# Patient Record
Sex: Male | Born: 1995
Health system: Southern US, Community
[De-identification: ages and names within clinical notes are randomized; demographics above are authoritative.]

## PROBLEM LIST (undated history)

## (undated) DIAGNOSIS — F419 Anxiety disorder, unspecified: Secondary | ICD-10-CM

## (undated) DIAGNOSIS — F319 Bipolar disorder, unspecified: Secondary | ICD-10-CM

---
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
## 2014-02-03 ENCOUNTER — Emergency Department (HOSPITAL_COMMUNITY)
Admission: EM | Admit: 2014-02-03 | Discharge: 2014-02-03 | Disposition: A | Discharge status: Home / Self Care | Payer: Medicaid Other | Attending: Emergency Medicine | Admitting: Emergency Medicine

## 2014-02-03 ENCOUNTER — Encounter (HOSPITAL_COMMUNITY): Payer: Self-pay | Admitting: Emergency Medicine

## 2014-02-03 DIAGNOSIS — K047 Periapical abscess without sinus: Secondary | ICD-10-CM | POA: Insufficient documentation

## 2014-02-03 DIAGNOSIS — F17200 Nicotine dependence, unspecified, uncomplicated: Secondary | ICD-10-CM | POA: Insufficient documentation

## 2014-02-03 DIAGNOSIS — Z792 Long term (current) use of antibiotics: Secondary | ICD-10-CM | POA: Insufficient documentation

## 2014-02-03 MED ORDER — AMOXICILLIN 500 MG PO CAPS: 500.0000 mg | ORAL_CAPSULE | Freq: Three times a day (TID) | ORAL | Status: DC

## 2014-02-03 MED ORDER — HYDROCODONE-ACETAMINOPHEN 5-325 MG PO TABS
1.0000 | ORAL_TABLET | Freq: Once | ORAL | Status: AC
  Administered 2014-02-03: 1 via ORAL
  Filled 2014-02-03: qty 1

## 2014-02-03 MED ORDER — AMOXICILLIN 500 MG PO CAPS
500.0000 mg | ORAL_CAPSULE | Freq: Once | ORAL | Status: AC
  Administered 2014-02-03: 500 mg via ORAL
  Filled 2014-02-03: qty 1

## 2014-02-03 MED ORDER — HYDROCODONE-ACETAMINOPHEN 5-325 MG PO TABS: 1.0000 | ORAL_TABLET | ORAL | Status: DC | PRN

## 2014-02-03 NOTE — ED Notes (Signed)
Pt presents with swollen area around tooth on L upper side of mouth. States he chipped the tooth several months ago. Also states he has "felt hot" for a few hours.

## 2014-02-03 NOTE — Discharge Instructions (Signed)
Please follow up with a dentist on Monday as discussed.  Return for any changing or worsening symptoms.    Dental Abscess A dental abscess is a collection of infected fluid (pus) from a bacterial infection in the inner part of the tooth (pulp). It usually occurs at the end of the tooth's root.  CAUSES   Severe tooth decay.  Trauma to the tooth that allows bacteria to enter into the pulp, such as a broken or chipped tooth. SYMPTOMS   Severe pain in and around the infected tooth.  Swelling and redness around the abscessed tooth or in the mouth or face.  Tenderness.  Pus drainage.  Bad breath.  Bitter taste in the mouth.  Difficulty swallowing.  Difficulty opening the mouth.  Nausea.  Vomiting.  Chills.  Swollen neck glands. DIAGNOSIS   A medical and dental history will be taken.  An examination will be performed by tapping on the abscessed tooth.  X-rays may be taken of the tooth to identify the abscess. TREATMENT The goal of treatment is to eliminate the infection. You may be prescribed antibiotic medicine to stop the infection from spreading. A root canal may be performed to save the tooth. If the tooth cannot be saved, it may be pulled (extracted) and the abscess may be drained.  HOME CARE INSTRUCTIONS  Only take over-the-counter or prescription medicines for pain, fever, or discomfort as directed by your caregiver.  Rinse your mouth (gargle) often with salt water ( tsp salt in 8 oz [250 ml] of warm water) to relieve pain or swelling.  Do not drive after taking pain medicine (narcotics).  Do not apply heat to the outside of your face.  Return to your dentist for further treatment as directed. SEEK MEDICAL CARE IF:  Your pain is not helped by medicine.  Your pain is getting worse instead of better. SEEK IMMEDIATE MEDICAL CARE IF:  You have a fever or persistent symptoms for more than 2 3 days.  You have a fever and your symptoms suddenly get  worse.  You have chills or a very bad headache.  You have problems breathing or swallowing.  You have trouble opening your mouth.  You have swelling in the neck or around the eye. Document Released: 08/11/2005 Document Revised: 05/05/2012 Document Reviewed: 11/19/2010 [facility] Patient Information 2014 [employer], [place].

## 2014-02-03 NOTE — ED Provider Notes (Signed)
CSN: [id number]     Arrival date & time 02/03/14  2136 History   First MD Initiated Contact with Patient 02/03/14 2204     Chief Complaint  Patient presents with  . Dental Pain   HPI  History provided by the patient. The patient is a 18 year old male presenting with complaints of left upper dental pain. Patient first began having pain and some swelling to the roof of his mouth 2 days ago. He reports having some problems with a molar tooth in the past but has never had this kind of pain or swelling. He has been taking some over-the-counter pain medicines without any significant relief of symptoms. He also reports slight hot sensations and feeling. Denies any fever. He denies any chills. Denies any swelling to the back of the throat or mouth. No difficulty breathing or swallowing. No other aggravating or alleviating factors. No associated symptoms.    History reviewed. No pertinent past medical history. History reviewed. No pertinent past surgical history. History reviewed. No pertinent family history. History  Substance Use Topics  . Smoking status: Current Every Day Smoker    Types: Cigarettes  . Smokeless tobacco: Not on file  . Alcohol Use: Yes    Review of Systems  Constitutional: Negative for fever, chills and diaphoresis.  All other systems reviewed and are negative.     Allergies  Review of patient's allergies indicates no known allergies.  Home Medications   Prior to Admission medications   Not on File   BP 122/76  Pulse 90  Temp(Src) 98.4 F (36.9 C) (Oral)  Resp 16  Ht 5\' 8"  (1.727 m)  Wt 150 lb (68.04 kg)  BMI 22.81 kg/m2  SpO2 97% Physical Exam  Nursing note and vitals reviewed. Constitutional: He is oriented to person, place, and time. He appears well-developed and well-nourished.  HENT:  Head: Normocephalic.  Mouth/Throat:    Left upper 1-2 molars.  Broken 2nd molar. Swelling to inner aspect of gums with fluctuance and tenderness.  Cardiovascular:  Normal rate and regular rhythm.   Pulmonary/Chest: Effort normal and breath sounds normal.  Neurological: He is alert and oriented to person, place, and time.  Skin: Skin is warm.  Psychiatric: He has a normal mood and affect. His behavior is normal.    ED Course  Procedures  COORDINATION OF CARE:  Nursing notes reviewed. Vital signs reviewed. Initial pt interview and examination performed.   Filed Vitals:   02/03/14 2141  BP: 122/76  Pulse: 90  Temp: 98.4 F (36.9 C)  TempSrc: Oral  Resp: 16  Height: 5\' 8"  (1.727 m)  Weight: 150 lb (68.04 kg)  SpO2: 97%    10:15 PM-patient seen and evaluated. Patient well-appearing no acute distress. Does not appear severely ill or toxic. Exam concerning for dental abscess.     INCISION AND DRAINAGE Performed by: Angus SellerAMMEN,Annamary Buschman S Consent: Verbal consent obtained. Risks and benefits: risks, benefits and alternatives were discussed Type: abscess  Body area: left upper 1-2 molars  Anesthesia: local infiltration  Incision was made with a scalpel.  Local anesthetic:  epinephrine  Anesthetic total: 1.8 ml  Complexity: simple  Drainage: purulent  Drainage amount: small  Packing material: none  Patient tolerance: Patient tolerated the procedure well with no immediate complications.        MDM   Final diagnoses:  Dental abscess       Angus Sellereter S Kristin Lamagna, PA-C 02/04/14 2022

## 2014-02-06 ENCOUNTER — Encounter (HOSPITAL_COMMUNITY): Payer: Self-pay | Admitting: Emergency Medicine

## 2014-02-06 NOTE — ED Provider Notes (Signed)
Medical screening examination/treatment/procedure(s) were performed by non-physician practitioner and as supervising physician I was immediately available for consultation/collaboration.   EKG Interpretation None        Ewell Sima Lindenberger, MD 02/06/14 0806 

## 2016-04-30 ENCOUNTER — Encounter (HOSPITAL_COMMUNITY): Payer: Self-pay | Admitting: *Deleted

## 2016-04-30 ENCOUNTER — Emergency Department (HOSPITAL_COMMUNITY)
Admission: EM | Admit: 2016-04-30 | Discharge: 2016-04-30 | Disposition: A | Discharge status: Home / Self Care | Payer: Medicaid Other | Attending: Dermatology | Admitting: Dermatology

## 2016-04-30 DIAGNOSIS — M79602 Pain in left arm: Secondary | ICD-10-CM | POA: Insufficient documentation

## 2016-04-30 DIAGNOSIS — Z5321 Procedure and treatment not carried out due to patient leaving prior to being seen by health care provider: Secondary | ICD-10-CM | POA: Insufficient documentation

## 2016-04-30 DIAGNOSIS — F17210 Nicotine dependence, cigarettes, uncomplicated: Secondary | ICD-10-CM | POA: Insufficient documentation

## 2016-04-30 NOTE — ED Notes (Signed)
Called for patient in subwaiting and main lobby without a response.  Patient waiting in subwaiting states patient stated he was going to leave.

## 2016-04-30 NOTE — ED Triage Notes (Signed)
The pt is c/o lt arm pain for 3 days with no injury

## 2016-04-30 NOTE — ED Notes (Signed)
Called patient again in subwaiting and main lobby without response.

## 2016-07-14 ENCOUNTER — Emergency Department (HOSPITAL_COMMUNITY)
Admission: EM | Admit: 2016-07-14 | Discharge: 2016-07-14 | Disposition: A | Discharge status: Home / Self Care | Payer: Medicaid Other | Attending: Emergency Medicine | Admitting: Emergency Medicine

## 2016-07-14 ENCOUNTER — Encounter (HOSPITAL_COMMUNITY): Payer: Self-pay

## 2016-07-14 ENCOUNTER — Emergency Department (HOSPITAL_COMMUNITY): Payer: Medicaid Other

## 2016-07-14 DIAGNOSIS — M79604 Pain in right leg: Secondary | ICD-10-CM | POA: Insufficient documentation

## 2016-07-14 DIAGNOSIS — F17210 Nicotine dependence, cigarettes, uncomplicated: Secondary | ICD-10-CM | POA: Insufficient documentation

## 2016-07-14 DIAGNOSIS — W19XXXA Unspecified fall, initial encounter: Secondary | ICD-10-CM

## 2016-07-14 DIAGNOSIS — S7001XA Contusion of right hip, initial encounter: Secondary | ICD-10-CM | POA: Insufficient documentation

## 2016-07-14 DIAGNOSIS — Y929 Unspecified place or not applicable: Secondary | ICD-10-CM | POA: Insufficient documentation

## 2016-07-14 DIAGNOSIS — Y990 Civilian activity done for income or pay: Secondary | ICD-10-CM | POA: Insufficient documentation

## 2016-07-14 DIAGNOSIS — W228XXA Striking against or struck by other objects, initial encounter: Secondary | ICD-10-CM | POA: Insufficient documentation

## 2016-07-14 DIAGNOSIS — Y939 Activity, unspecified: Secondary | ICD-10-CM | POA: Insufficient documentation

## 2016-07-14 MED ORDER — TRAMADOL HCL 50 MG PO TABS: 50.0000 mg | ORAL_TABLET | Freq: Four times a day (QID) | ORAL | 0 refills | Status: DC | PRN

## 2016-07-14 MED ORDER — IBUPROFEN 600 MG PO TABS: 600.0000 mg | ORAL_TABLET | Freq: Four times a day (QID) | ORAL | 0 refills | Status: DC | PRN

## 2016-07-14 MED ORDER — IBUPROFEN 400 MG PO TABS
600.0000 mg | ORAL_TABLET | Freq: Once | ORAL | Status: AC
  Administered 2016-07-14: 600 mg via ORAL
  Filled 2016-07-14: qty 1

## 2016-07-14 NOTE — ED Notes (Signed)
Patient taken to XRAY

## 2016-07-14 NOTE — ED Notes (Signed)
Patient walked independently to the room. No acute distress noted  

## 2016-07-14 NOTE — ED Notes (Signed)
Pt stable, understands discharge instructions, and reasons for return.   

## 2016-07-14 NOTE — ED Triage Notes (Signed)
Pt complaining of R leg pain x 1 week. Pt states slipped on stairs fell down 2 steps. Pt denies any head injury/trauma. Pt ambulatory at triage.

## 2016-07-14 NOTE — ED Provider Notes (Signed)
[facility] Provider Note   CSN: [id number] Arrival date & time: 07/14/16  2140     History   Chief Complaint Chief Complaint  Patient presents with  . Leg Pain    HPI Arthur Ramos is a 20 y.o. male.  This a normally healthy 20 year old male who states that while at work a week ago he fell, landing on his right hip on a metal step and since that time, the pain is progressively gotten worse.  Now the pain is radiating to his entire leg is not taking any medication for discomfort.  He has been up moving around and working until today when the pain was so bad that he had to leave work.  Denies any numbness or tingling, decreased range of motion      History reviewed. No pertinent past medical history.  There are no active problems to display for this patient.   History reviewed. No pertinent surgical history.     Home Medications    Prior to Admission medications   Medication Sig Start Date End Date Taking? Authorizing Provider  amoxicillin (AMOXIL) 500 MG capsule Take 1 capsule (500 mg total) by mouth 3 (three) times daily. Patient not taking: Reported on 07/14/2016 02/03/14   Ivonne AndrewPeter Dammen, PA-C  HYDROcodone-acetaminophen (NORCO/VICODIN) 5-325 MG per tablet Take 1-2 tablets by mouth every 4 (four) hours as needed for moderate pain. Patient not taking: Reported on 07/14/2016 02/03/14   Ivonne AndrewPeter Dammen, PA-C  ibuprofen (ADVIL,MOTRIN) 600 MG tablet Take 1 tablet (600 mg total) by mouth every 6 (six) hours as needed. 07/14/16   Earley FavorGail Lashawn Bromwell, NP  traMADol (ULTRAM) 50 MG tablet Take 1 tablet (50 mg total) by mouth every 6 (six) hours as needed. 07/14/16   Earley FavorGail Mccayla Shimada, NP    Family History History reviewed. No pertinent family history.  Social History Social History  Substance Use Topics  . Smoking status: Current Every Day Smoker    Types: Cigarettes, Cigars  . Smokeless tobacco: Never Used  . Alcohol use Yes     Allergies   Patient has no known  allergies.   Review of Systems Review of Systems  HENT: Negative.   Respiratory: Negative.   Gastrointestinal: Negative.   Endocrine: Negative.   Musculoskeletal: Positive for arthralgias.  Neurological: Negative for weakness and numbness.  All other systems reviewed and are negative.    Physical Exam Updated Vital Signs BP 102/65 (BP Location: Right Arm)   Pulse 76   Temp 98.3 F (36.8 C) (Oral)   Resp 18   Ht 5\' 9"  (1.753 m)   Wt 72.6 kg   SpO2 100%   BMI 23.63 kg/m   Physical Exam  Constitutional: He appears well-developed and well-nourished.  HENT:  Head: Normocephalic and atraumatic.  Eyes: Pupils are equal, round, and reactive to light.  Neck: Normal range of motion.  Cardiovascular: Normal rate.   Pulmonary/Chest: Effort normal.  Musculoskeletal: Normal range of motion. He exhibits tenderness. He exhibits no deformity.       Right hip: He exhibits tenderness. He exhibits normal range of motion, normal strength, no bony tenderness, no swelling, no crepitus and no deformity.  Hematoma, swelling, laceration.  Negative Homans sign.  No swelling distal to the injured area.  Full range of motion of the knee, ankle.  Toes  Skin: Skin is warm and dry.  Nursing note and vitals reviewed.    ED Treatments / Results  Labs (all labs ordered are listed, but only abnormal results are  displayed) Labs Reviewed - No data to display  EKG  EKG Interpretation None       Radiology Dg Hip Unilat With Pelvis 2-3 Views Right  Result Date: 07/14/2016 CLINICAL DATA:  Right leg pain for 1 week, history of fall EXAM: DG HIP (WITH OR WITHOUT PELVIS) 2-3V RIGHT COMPARISON:  None. FINDINGS: No fracture or dislocation is evident. There is a right pelvic calcification. Pubic symphysis appears intact. Incidental note made of incomplete fusion of the posterior elements of the sacrum. IMPRESSION: No acute osseous abnormality. Electronically Signed   By: Jasmine PangKim  Fujinaga M.D.   On:  07/14/2016 22:43    Procedures Procedures (including critical care time)  Medications Ordered in ED Medications  ibuprofen (ADVIL,MOTRIN) tablet 600 mg (600 mg Oral Given 07/14/16 2224)     Initial Impression / Assessment and Plan / ED Course  I have reviewed the triage vital signs and the nursing notes.  Pertinent labs & imaging results that were available during my care of the patient were reviewed by me and considered in my medical decision making (see chart for details).  Clinical Course      I feel this is a deep bruise.  I will x-ray.  For thoroughness sake, but doubt that there is a fracture.  Since the patient has been able to write for the past week since the incident  Final Clinical Impressions(s) / ED Diagnoses   Final diagnoses:  Right leg pain  Contusion of right hip, initial encounter    New Prescriptions New Prescriptions   IBUPROFEN (ADVIL,MOTRIN) 600 MG TABLET    Take 1 tablet (600 mg total) by mouth every 6 (six) hours as needed.   TRAMADOL (ULTRAM) 50 MG TABLET    Take 1 tablet (50 mg total) by mouth every 6 (six) hours as needed.     Earley FavorGail Saran Laviolette, NP 07/14/16 2312    Earley FavorGail Amara Manalang, NP 07/14/16 2316    Mancel BaleElliott Wentz, MD 07/15/16 [phone]

## 2017-10-20 ENCOUNTER — Other Ambulatory Visit: Payer: Self-pay

## 2017-10-20 ENCOUNTER — Ambulatory Visit (HOSPITAL_COMMUNITY)
Admission: EM | Admit: 2017-10-20 | Discharge: 2017-10-20 | Disposition: A | Discharge status: Home / Self Care | Payer: 59 | Attending: Family Medicine | Admitting: Family Medicine

## 2017-10-20 ENCOUNTER — Encounter (HOSPITAL_COMMUNITY): Payer: Self-pay | Admitting: Emergency Medicine

## 2017-10-20 DIAGNOSIS — K529 Noninfective gastroenteritis and colitis, unspecified: Secondary | ICD-10-CM | POA: Diagnosis not present

## 2017-10-20 MED ORDER — ONDANSETRON 4 MG PO TBDP: 4.0000 mg | ORAL_TABLET | Freq: Three times a day (TID) | ORAL | 0 refills | Status: DC | PRN

## 2017-10-20 NOTE — ED Triage Notes (Signed)
Pt reports waking up at 0630 with vomiting.  He reports up to 20 episodes with his last episode at 1830.  He states he has had some Gatorade here that he has been able to keep down so far.

## 2017-10-20 NOTE — Discharge Instructions (Signed)

## 2017-10-21 NOTE — ED Provider Notes (Signed)
  [facility]   [id number] 10/20/17 Arrival Time: 1910  ASSESSMENT & PLAN:  1. Gastroenteritis   Day #1  Meds ordered this encounter  Medications  . ondansetron (ZOFRAN-ODT) 4 MG disintegrating tablet    Sig: Take 1 tablet (4 mg total) by mouth every 8 (eight) hours as needed for nausea or vomiting.    Dispense:  15 tablet    Refill:  0   Discussed typical duration of symptoms for suspected viral GI illness. Will do his best to ensure adequate fluid intake in order to avoid dehydration. Will proceed to the Emergency Department for evaluation if unable to tolerate PO fluids regularly.  Otherwise he will f/u with his PCP or here if not showing improvement over the next 48-72 hours.  Reviewed expectations re: course of current medical issues. Questions answered. Outlined signs and symptoms indicating need for more acute intervention. Patient verbalized understanding. After Visit Summary given.   SUBJECTIVE: History from: patient.  Arthur Ramos is a 22 y.o. male who presents with complaint of intermittent nausea and vomiting of undigested food with diarrhea. Onset abrupt, today. Abdominal discomfort: mild and cramping. Symptoms are unchanged since beginning. Aggravating factors: eating. Alleviating factors: none. Associated symptoms: fatigue. He denies arthralgias, fever and myalgias. Appetite: decreased. PO intake: decreased. Ambulatory without assistance. Urinary symptoms: none. Last bowel movement today without blood. OTC treatment: none.  History reviewed. No pertinent surgical history.  ROS: As per HPI.  OBJECTIVE:  Vitals:   10/20/17 1948  BP: 116/64  Pulse: 82  Temp: 98.6 F (37 C)  TempSrc: Oral  SpO2: 100%    General appearance: alert; no distress Lungs: clear to auscultation bilaterally Heart: regular rate and rhythm Abdomen: soft; non-distended; no significant abdominal tenderness, "just a cramping feeling"; bowel sounds present; no masses or  organomegaly; no guarding or rebound tenderness Back: no CVA tenderness Extremities: no edema; symmetrical with no gross deformities Skin: warm and dry Neurologic: normal gait Psychological: alert and cooperative; normal mood and affect   No Known Allergies                                              Social History   Socioeconomic History  . Marital status: Single    Spouse name: Not on file  . Number of children: Not on file  . Years of education: Not on file  . Highest education level: Not on file  Social Needs  . Financial resource strain: Not on file  . Food insecurity - worry: Not on file  . Food insecurity - inability: Not on file  . Transportation needs - medical: Not on file  . Transportation needs - non-medical: Not on file  Occupational History  . Not on file  Tobacco Use  . Smoking status: Current Every Day Smoker    Types: Cigarettes, Cigars  . Smokeless tobacco: Never Used  Substance and Sexual Activity  . Alcohol use: Yes  . Drug use: Not on file  . Sexual activity: Not on file  Other Topics Concern  . Not on file  Social History Narrative   ** Merged History Encounter Mardella Layman**          Basya Casavant, MD 10/21/17 [phone]

## 2018-04-12 ENCOUNTER — Other Ambulatory Visit: Payer: Self-pay

## 2018-04-12 ENCOUNTER — Emergency Department (HOSPITAL_COMMUNITY)
Admission: EM | Admit: 2018-04-12 | Discharge: 2018-04-12 | Disposition: A | Discharge status: Home / Self Care | Payer: 59 | Attending: Emergency Medicine | Admitting: Emergency Medicine

## 2018-04-12 ENCOUNTER — Encounter (HOSPITAL_COMMUNITY): Payer: Self-pay | Admitting: Obstetrics and Gynecology

## 2018-04-12 DIAGNOSIS — F1010 Alcohol abuse, uncomplicated: Secondary | ICD-10-CM | POA: Insufficient documentation

## 2018-04-12 DIAGNOSIS — F17210 Nicotine dependence, cigarettes, uncomplicated: Secondary | ICD-10-CM | POA: Diagnosis not present

## 2018-04-12 DIAGNOSIS — R109 Unspecified abdominal pain: Secondary | ICD-10-CM | POA: Diagnosis not present

## 2018-04-12 LAB — URINALYSIS, ROUTINE W REFLEX MICROSCOPIC
BACTERIA UA: NONE SEEN
BILIRUBIN URINE: NEGATIVE
Glucose, UA: NEGATIVE mg/dL
Hgb urine dipstick: NEGATIVE
Ketones, ur: NEGATIVE mg/dL
Nitrite: NEGATIVE
PH: 6 (ref 5.0–8.0)
Protein, ur: NEGATIVE mg/dL
Specific Gravity, Urine: 1.028 (ref 1.005–1.030)

## 2018-04-12 LAB — COMPREHENSIVE METABOLIC PANEL
ALBUMIN: 4.5 g/dL (ref 3.5–5.0)
ALT: 15 U/L (ref 0–44)
AST: 23 U/L (ref 15–41)
Alkaline Phosphatase: 44 U/L (ref 38–126)
Anion gap: 9 (ref 5–15)
BUN: 11 mg/dL (ref 6–20)
CO2: 25 mmol/L (ref 22–32)
Calcium: 9.1 mg/dL (ref 8.9–10.3)
Chloride: 107 mmol/L (ref 98–111)
Creatinine, Ser: 1.08 mg/dL (ref 0.61–1.24)
GFR calc Af Amer: >60 mL/min (ref >60)
GFR calc non Af Amer: >60 mL/min (ref >60)
GLUCOSE: 108 mg/dL — AB (ref 70–99)
POTASSIUM: 3.9 mmol/L (ref 3.5–5.1)
SODIUM: 141 mmol/L (ref 135–145)
Total Bilirubin: 0.8 mg/dL (ref 0.3–1.2)
Total Protein: 7.5 g/dL (ref 6.5–8.1)

## 2018-04-12 LAB — CBC
HEMATOCRIT: 43.7 % (ref 39.0–52.0)
HEMOGLOBIN: 14.7 g/dL (ref 13.0–17.0)
MCH: 28.3 pg (ref 26.0–34.0)
MCHC: 33.6 g/dL (ref 30.0–36.0)
MCV: 84.2 fL (ref 78.0–100.0)
Platelets: 328 10*3/uL (ref 150–400)
RBC: 5.19 MIL/uL (ref 4.22–5.81)
RDW: 14.5 % (ref 11.5–15.5)
WBC: 6.9 10*3/uL (ref 4.0–10.5)

## 2018-04-12 LAB — LIPASE, BLOOD: Lipase: 24 U/L (ref 11–51)

## 2018-04-12 NOTE — ED Triage Notes (Signed)
Pt reports he has been having abdominal pain x2 months. Pt reports some constipation. Pt reports last year he was a heavy drinker (Approximately 4 drinks a day) Pt reports he is concerned about his liver.  Pt is currently eating [employer] and drinking a soda. RN advised him not to eat or drink anything else until evaluated by a MD.

## 2018-04-12 NOTE — ED Provider Notes (Signed)
[facility] Provider Note   CSN: [id number] Arrival date & time: 04/12/18  1217     History   Chief Complaint Chief Complaint  Patient presents with  . Abdominal Pain    HPI Arthur Ramos is a 22 y.o. male.  Who presents the emergency department for evaluation of abdominal pain.  He has no significant past medical history.  Patient states that he has pain on both sides of his abdomen that has been ongoing for about the past 2 months.  He cannot think of any aggravating or alleviating symptoms.  He states that he had some constipation but that is not normal for him and that his pain is not associated with constipation.  He denies nausea, vomiting, diarrhea, fevers, chills.  He denies urinary symptoms.  The patient does admit that he drinks heavily however he does not drink as heavily as he used to.  Patient states that he used to drink 3 pints of Hennessy a day.  Now he restricts his drinking to the weekends but drinks about 3 bottles daily over the weekends.  He does not feel that his pain is associated with the alcohol use.   HPI  No past medical history on file.  There are no active problems to display for this patient.   No past surgical history on file.      Home Medications    Prior to Admission medications   Medication Sig Start Date End Date Taking? Authorizing Provider  ibuprofen (ADVIL,MOTRIN) 600 MG tablet Take 1 tablet (600 mg total) by mouth every 6 (six) hours as needed. Patient not taking: Reported on 04/12/2018 07/14/16   Earley FavorSchulz, Gail, NP  ondansetron (ZOFRAN-ODT) 4 MG disintegrating tablet Take 1 tablet (4 mg total) by mouth every 8 (eight) hours as needed for nausea or vomiting. Patient not taking: Reported on 04/12/2018 10/20/17   Mardella LaymanHagler, Brian, MD  traMADol (ULTRAM) 50 MG tablet Take 1 tablet (50 mg total) by mouth every 6 (six) hours as needed. Patient not taking: Reported on 04/12/2018 07/14/16   Earley FavorSchulz, Gail, NP     Family History History reviewed. No pertinent family history.  Social History Social History   Tobacco Use  . Smoking status: Current Every Day Smoker    Types: Cigarettes, Cigars  . Smokeless tobacco: Never Used  Substance Use Topics  . Alcohol use: Yes    Alcohol/week: 30.0 standard drinks    Types: 30 Standard drinks or equivalent per week  . Drug use: Yes    Types: Marijuana     Allergies   Patient has no known allergies.   Review of Systems Review of Systems  Ten systems reviewed and are negative for acute change, except as noted in the HPI.   Physical Exam Updated Vital Signs BP 110/74 (BP Location: Left Arm)   Pulse 99   Temp 98.5 F (36.9 C) (Oral)   Resp 16   Ht 5\' 9"  (1.753 m)   Wt 72.6 kg   SpO2 96%   BMI 23.63 kg/m   Physical Exam  Constitutional: He appears well-developed and well-nourished. No distress.  HENT:  Head: Normocephalic and atraumatic.  Eyes: Conjunctivae are normal. No scleral icterus.  Neck: Normal range of motion. Neck supple.  Cardiovascular: Normal rate, regular rhythm and normal heart sounds.  Pulmonary/Chest: Effort normal and breath sounds normal. No respiratory distress.  Abdominal: Soft. Bowel sounds are normal. There is no tenderness.  Musculoskeletal: He exhibits no edema.  Neurological: He  is alert.  Skin: Skin is warm and dry. He is not diaphoretic.  Psychiatric: His behavior is normal.  Nursing note and vitals reviewed.    ED Treatments / Results  Labs (all labs ordered are listed, but only abnormal results are displayed) Labs Reviewed  COMPREHENSIVE METABOLIC PANEL - Abnormal; Notable for the following components:      Result Value   Glucose, Bld 108 (*)    All other components within normal limits  LIPASE, BLOOD  CBC  URINALYSIS, ROUTINE W REFLEX MICROSCOPIC    EKG None  Radiology No results found.  Procedures Procedures (including critical care time)  Medications Ordered in ED Medications  - No data to display   Initial Impression / Assessment and Plan / ED Course  I have reviewed the triage vital signs and the nursing notes.  Pertinent labs & imaging results that were available during my care of the patient were reviewed by me and considered in my medical decision making (see chart for details).  Clinical Course as of Apr 12 1456  Mon Apr 12, 2018  1456 Patient just completed a meal form [employer]  Glucose(!): 108 [AH]    Clinical Course User Index [AH] Arthor CaptainHarris, Jeret Goyer, PA-C    Patient is nontoxic, nonseptic appearing, in no apparent distress.  Patient's pain and other symptoms adequately managed in emergency department.  Fluid bolus given.  Labs vitals reviewed.  Patient does not meet the SIRS or Sepsis criteria.  On repeat exam patient does not have a surgical abdomin and there are no peritoneal signs.  No indication of appendicitis, bowel obstruction, bowel perforation, cholecystitis, diverticulitis, .  Patient discharged home with symptomatic treatment and given strict instructions for follow-up with their primary care physician.  I have also discussed reasons to return immediately to the ER.  Patient expresses understanding and agrees with plan.     Final Clinical Impressions(s) / ED Diagnoses   Final diagnoses:  Abdominal pain, unspecified abdominal location  Alcohol abuse    ED Discharge Orders    None       Arthor CaptainHarris, [place] Bohac, PA-C 04/12/18 2333    Pricilla LovelessGoldston, Scott, MD 04/13/18 [phone]

## 2018-04-12 NOTE — Discharge Instructions (Signed)

## 2018-04-19 ENCOUNTER — Other Ambulatory Visit: Payer: Self-pay

## 2018-04-19 ENCOUNTER — Emergency Department (HOSPITAL_COMMUNITY)
Admission: EM | Admit: 2018-04-19 | Discharge: 2018-04-19 | Disposition: A | Discharge status: Home / Self Care | Payer: 59 | Attending: Emergency Medicine | Admitting: Emergency Medicine

## 2018-04-19 ENCOUNTER — Encounter (HOSPITAL_COMMUNITY): Payer: Self-pay | Admitting: Emergency Medicine

## 2018-04-19 DIAGNOSIS — Z202 Contact with and (suspected) exposure to infections with a predominantly sexual mode of transmission: Secondary | ICD-10-CM | POA: Diagnosis not present

## 2018-04-19 DIAGNOSIS — Z5321 Procedure and treatment not carried out due to patient leaving prior to being seen by health care provider: Secondary | ICD-10-CM | POA: Diagnosis not present

## 2018-04-19 LAB — URINALYSIS, ROUTINE W REFLEX MICROSCOPIC
Bilirubin Urine: NEGATIVE
GLUCOSE, UA: NEGATIVE mg/dL
Hgb urine dipstick: NEGATIVE
KETONES UR: NEGATIVE mg/dL
NITRITE: NEGATIVE
PROTEIN: 30 mg/dL — AB
Specific Gravity, Urine: 1.017 (ref 1.005–1.030)
pH: 8 (ref 5.0–8.0)

## 2018-04-19 MED ORDER — CEFTRIAXONE SODIUM 250 MG IJ SOLR: 250.0000 mg | Freq: Once | INTRAMUSCULAR | Status: DC

## 2018-04-19 MED ORDER — AZITHROMYCIN 250 MG PO TABS: 1000.0000 mg | ORAL_TABLET | Freq: Once | ORAL | Status: DC

## 2018-04-19 NOTE — ED Notes (Signed)
Pt left AMA unable to find pt at this time to give medication as prescribed.

## 2018-04-19 NOTE — ED Triage Notes (Signed)
Pt c/o penile discharge and pain with urination. States his partner may have tested positive.

## 2018-04-20 ENCOUNTER — Encounter (HOSPITAL_COMMUNITY): Payer: Self-pay | Admitting: Emergency Medicine

## 2018-04-20 ENCOUNTER — Ambulatory Visit (HOSPITAL_COMMUNITY)
Admission: EM | Admit: 2018-04-20 | Discharge: 2018-04-20 | Disposition: A | Discharge status: Home / Self Care | Payer: 59 | Attending: Family Medicine | Admitting: Family Medicine

## 2018-04-20 ENCOUNTER — Other Ambulatory Visit: Payer: Self-pay

## 2018-04-20 DIAGNOSIS — A549 Gonococcal infection, unspecified: Secondary | ICD-10-CM | POA: Diagnosis not present

## 2018-04-20 LAB — GC/CHLAMYDIA PROBE AMP (~~LOC~~) NOT AT ARMC
Chlamydia: NEGATIVE
Neisseria Gonorrhea: POSITIVE — AB

## 2018-04-20 MED ORDER — AZITHROMYCIN 250 MG PO TABS
ORAL_TABLET | ORAL | Status: AC
  Filled 2018-04-20: qty 4

## 2018-04-20 MED ORDER — CEFTRIAXONE SODIUM 250 MG IJ SOLR
INTRAMUSCULAR | Status: AC
  Filled 2018-04-20: qty 250

## 2018-04-20 MED ORDER — AZITHROMYCIN 250 MG PO TABS
1000.0000 mg | ORAL_TABLET | Freq: Once | ORAL | Status: AC
  Administered 2018-04-20: 1000 mg via ORAL

## 2018-04-20 MED ORDER — CEFTRIAXONE SODIUM 250 MG IJ SOLR
250.0000 mg | Freq: Once | INTRAMUSCULAR | Status: AC
  Administered 2018-04-20: 250 mg via INTRAMUSCULAR

## 2018-04-20 NOTE — ED Triage Notes (Addendum)
Penile discharge and pain since yesterday  Went to ed yesterday, but unable to stay for treatment

## 2018-04-20 NOTE — ED Provider Notes (Signed)
[facility]    CSN: [id number] Arrival date & time: 04/20/18  1811     [id number]   Chief Complaint Chief Complaint  Patient presents with  . SEXUALLY TRANSMITTED DISEASE    HPI Arthur Ramos is a 22 y.o. male.   22 year old male comes in for further history of penile discharge.  He went to the emergency department last night, got tested, but left before being seen.  He denies abdominal pain, nausea, vomiting.  Denies fever, chills, night sweats.  Denies urinary symptoms such as frequency, dysuria, hematuria.  Denies penile sore, testicular swelling, testicular pain.  He is sexually active with one male partner, no condom use.     History reviewed. No pertinent past medical history.  There are no active problems to display for this patient.   History reviewed. No pertinent surgical history.     Home Medications    Prior to Admission medications   Medication Sig Start Date End Date Taking? Authorizing Provider  ibuprofen (ADVIL,MOTRIN) 600 MG tablet Take 1 tablet (600 mg total) by mouth every 6 (six) hours as needed. Patient not taking: Reported on 04/12/2018 07/14/16   Earley FavorSchulz, Gail, NP  ondansetron (ZOFRAN-ODT) 4 MG disintegrating tablet Take 1 tablet (4 mg total) by mouth every 8 (eight) hours as needed for nausea or vomiting. Patient not taking: Reported on 04/12/2018 10/20/17   Mardella LaymanHagler, Brian, MD  traMADol (ULTRAM) 50 MG tablet Take 1 tablet (50 mg total) by mouth every 6 (six) hours as needed. Patient not taking: Reported on 04/12/2018 07/14/16   Earley FavorSchulz, Gail, NP    Family History Family History  Problem Relation Age of Onset  . Healthy Mother   . Healthy Father     Social History Social History   Tobacco Use  . Smoking status: Current Every Day Smoker    Types: Cigarettes, Cigars  . Smokeless tobacco: Never Used  Substance Use Topics  . Alcohol use: Yes    Alcohol/week: 30.0 standard drinks    Types: 30 Standard drinks or equivalent per week   . Drug use: Yes    Types: Marijuana     Allergies   Patient has no known allergies.   Review of Systems Review of Systems  Reason unable to perform ROS: See HPI as above.     Physical Exam Triage Vital Signs ED Triage Vitals  Enc Vitals Group     BP 04/20/18 1920 117/79     Pulse Rate 04/20/18 1920 60     Resp 04/20/18 1920 18     Temp 04/20/18 1920 97.8 F (36.6 C)     Temp Source 04/20/18 1920 Oral     SpO2 04/20/18 1920 97 %     Weight --      Height --      Head Circumference --      Peak Flow --      Pain Score 04/20/18 1917 7     Pain Loc --      Pain Edu? --      Excl. in GC? --    No data found.  Updated Vital Signs BP 117/79 (BP Location: Right Arm)   Pulse 60   Temp 97.8 F (36.6 C) (Oral)   Resp 18   SpO2 97%   Physical Exam  Constitutional: He is oriented to person, place, and time. He appears well-developed and well-nourished. No distress.  HENT:  Head: Normocephalic and atraumatic.  Eyes: Pupils are equal, round, and reactive to  light. Conjunctivae are normal.  Neurological: He is alert and oriented to person, place, and time.  Skin: He is not diaphoretic.     UC Treatments / Results  Labs (all labs ordered are listed, but only abnormal results are displayed) Labs Reviewed - No data to display  EKG None  Radiology No results found.  Procedures Procedures (including critical care time)  Medications Ordered in UC Medications  azithromycin (ZITHROMAX) tablet 1,000 mg (1,000 mg Oral Given 04/20/18 1955)  cefTRIAXone (ROCEPHIN) injection 250 mg (250 mg Intramuscular Given 04/20/18 1955)    Initial Impression / Assessment and Plan / UC Course  I have reviewed the triage vital signs and the nursing notes.  Pertinent labs & imaging results that were available during my care of the patient were reviewed by me and considered in my medical decision making (see chart for details).    Cytology from emergency department positive for  gonorrhea. Azithromycin and Rocephin given in office today. Patient to refrain from sexual activity for the next 7 days. Return precautions given.   Final Clinical Impressions(s) / UC Diagnoses   Final diagnoses:  Gonorrhea    ED Prescriptions    None        Belinda Fisher, PA-C 04/20/18 2040

## 2018-04-20 NOTE — Discharge Instructions (Signed)
Your testing in the emergency department yesterday was positive for gonorrhea.  Azithromycin 1g by mouth and Rocephin 250mg  injection given in office today.  Please inform sexual partners of the result.  Refrain from sexual activity for the next 7 days.  If experiencing worsening symptoms, penile lesion/sore, testicular swelling, testicular pain, follow-up for reevaluation.

## 2018-07-16 ENCOUNTER — Ambulatory Visit (HOSPITAL_COMMUNITY)
Admission: EM | Admit: 2018-07-16 | Discharge: 2018-07-16 | Disposition: A | Discharge status: Home / Self Care | Payer: 59 | Attending: Family Medicine | Admitting: Family Medicine

## 2018-07-16 ENCOUNTER — Ambulatory Visit (INDEPENDENT_AMBULATORY_CARE_PROVIDER_SITE_OTHER): Payer: 59

## 2018-07-16 ENCOUNTER — Encounter (HOSPITAL_COMMUNITY): Payer: Self-pay | Admitting: Emergency Medicine

## 2018-07-16 DIAGNOSIS — S60221A Contusion of right hand, initial encounter: Secondary | ICD-10-CM | POA: Diagnosis not present

## 2018-07-16 DIAGNOSIS — W208XXA Other cause of strike by thrown, projected or falling object, initial encounter: Secondary | ICD-10-CM | POA: Insufficient documentation

## 2018-07-16 DIAGNOSIS — Z202 Contact with and (suspected) exposure to infections with a predominantly sexual mode of transmission: Secondary | ICD-10-CM | POA: Insufficient documentation

## 2018-07-16 DIAGNOSIS — F17210 Nicotine dependence, cigarettes, uncomplicated: Secondary | ICD-10-CM | POA: Diagnosis not present

## 2018-07-16 DIAGNOSIS — Z79899 Other long term (current) drug therapy: Secondary | ICD-10-CM | POA: Insufficient documentation

## 2018-07-16 DIAGNOSIS — M79643 Pain in unspecified hand: Secondary | ICD-10-CM | POA: Diagnosis present

## 2018-07-16 DIAGNOSIS — S6991XA Unspecified injury of right wrist, hand and finger(s), initial encounter: Secondary | ICD-10-CM | POA: Diagnosis not present

## 2018-07-16 MED ORDER — DICLOFENAC SODIUM 75 MG PO TBEC: 75.0000 mg | DELAYED_RELEASE_TABLET | Freq: Two times a day (BID) | ORAL | 0 refills | Status: DC

## 2018-07-16 NOTE — ED Triage Notes (Signed)
Pt c/o R hand pain, states "something fell on it". Pt also requesting std testing.

## 2018-07-16 NOTE — Discharge Instructions (Addendum)
X-rays are negative for fracture.  Urine test results should be available Sunday.  We will call you if positive.

## 2018-07-16 NOTE — ED Provider Notes (Signed)
[facility]    CSN: [id number] Arrival date & time: 07/16/18  1936     History   Chief Complaint Chief Complaint  Patient presents with  . Hand Pain  . SEXUALLY TRANSMITTED DISEASE    HPI Arthur Ramos is a 22 y.o. male.   Is a 22 year old man who complains about right hand pain that began yesterday when a metal plate fell on his dorsal right hand.  He has had swelling and tenderness over the middle metacarpal and MCP joint.  Go to work today but and so he would like a note.  Patient would also like to be checked for STD.  He has no symptoms such as dysuria, discharge, or rash.     History reviewed. No pertinent past medical history.  There are no active problems to display for this patient.   History reviewed. No pertinent surgical history.     Home Medications    Prior to Admission medications   Medication Sig Start Date End Date Taking? Authorizing Provider  diclofenac (VOLTAREN) 75 MG EC tablet Take 1 tablet (75 mg total) by mouth 2 (two) times daily. 07/16/18   Elvina Sidle, MD    Family History Family History  Problem Relation Age of Onset  . Healthy Mother   . Healthy Father     Social History Social History   Tobacco Use  . Smoking status: Current Every Day Smoker    Types: Cigarettes, Cigars  . Smokeless tobacco: Never Used  Substance Use Topics  . Alcohol use: Yes    Alcohol/week: 30.0 standard drinks    Types: 30 Standard drinks or equivalent per week  . Drug use: Yes    Types: Marijuana     Allergies   Patient has no known allergies.   Review of Systems Review of Systems  Constitutional: Negative.   HENT: Negative.   Musculoskeletal: Positive for joint swelling.     Physical Exam Triage Vital Signs ED Triage Vitals  Enc Vitals Group     BP 07/16/18 2001 125/77     Pulse Rate 07/16/18 2001 72     Resp 07/16/18 2001 16     Temp 07/16/18 2001 98.6 F (37 C)     Temp src --      SpO2 07/16/18 2001 100  %     Weight --      Height --      Head Circumference --      Peak Flow --      Pain Score 07/16/18 2002 3     Pain Loc --      Pain Edu? --      Excl. in GC? --    No data found.  Updated Vital Signs BP 125/77   Pulse 72   Temp 98.6 F (37 C)   Resp 16   SpO2 100%    Physical Exam  Constitutional: He is oriented to person, place, and time. He appears well-developed and well-nourished.  HENT:  Right Ear: External ear normal.  Left Ear: External ear normal.  Mouth/Throat: Oropharynx is clear and moist.  Eyes: Pupils are equal, round, and reactive to light. Conjunctivae are normal.  Neck: Normal range of motion. Neck supple.  Pulmonary/Chest: Effort normal.  Musculoskeletal: He exhibits tenderness. He exhibits no deformity.  Tender right metacarpal dorsally, right MCP joint with swelling  Neurological: He is alert and oriented to person, place, and time.  Skin: Skin is warm and dry.  Nursing note and  vitals reviewed.    UC Treatments / Results  Labs (all labs ordered are listed, but only abnormal results are displayed) Labs Reviewed  URINE CYTOLOGY ANCILLARY ONLY    EKG None  Radiology Dg Hand Complete Right  Result Date: 07/16/2018 CLINICAL DATA:  Heavy object fell on hand. EXAM: RIGHT HAND - COMPLETE 3+ VIEW COMPARISON:  None. FINDINGS: There is no evidence of fracture or dislocation. There is no evidence of arthropathy or other focal bone abnormality. Soft tissues are unremarkable. IMPRESSION: Negative. Electronically Signed   By: Awilda Metroourtnay  Bloomer M.D.   On: 07/16/2018 20:27    Procedures Procedures (including critical care time)  Medications Ordered in UC Medications - No data to display  Initial Impression / Assessment and Plan / UC Course  I have reviewed the triage vital signs and the nursing notes.  Pertinent labs & imaging results that were available during my care of the patient were reviewed by me and considered in my medical decision making  (see chart for details).    Final Clinical Impressions(s) / UC Diagnoses   Final diagnoses:  Contusion of right hand, initial encounter     Discharge Instructions     X-rays are negative for fracture.  Urine test results should be available Sunday.  We will call you if positive.    ED Prescriptions    Medication Sig Dispense Auth. Provider   diclofenac (VOLTAREN) 75 MG EC tablet Take 1 tablet (75 mg total) by mouth 2 (two) times daily. 14 tablet Elvina SidleLauenstein, Gerik Coberly, MD     Controlled Substance Prescriptions [place] Controlled Substance Registry consulted? Not Applicable   Elvina SidleLauenstein, Tekisha Darcey, MD 07/16/18 2037

## 2018-07-19 LAB — URINE CYTOLOGY ANCILLARY ONLY
Chlamydia: NEGATIVE
Neisseria Gonorrhea: NEGATIVE
Trichomonas: NEGATIVE

## 2018-08-06 ENCOUNTER — Encounter (HOSPITAL_COMMUNITY): Payer: Self-pay | Admitting: Emergency Medicine

## 2018-08-06 ENCOUNTER — Ambulatory Visit (HOSPITAL_COMMUNITY)
Admission: EM | Admit: 2018-08-06 | Discharge: 2018-08-06 | Disposition: A | Discharge status: Home / Self Care | Payer: 59 | Attending: Internal Medicine | Admitting: Internal Medicine

## 2018-08-06 DIAGNOSIS — K529 Noninfective gastroenteritis and colitis, unspecified: Secondary | ICD-10-CM | POA: Diagnosis not present

## 2018-08-06 NOTE — ED Triage Notes (Signed)
[facility] 2 episodes of emesis at work today.  Sent here to be cleared.

## 2018-08-06 NOTE — ED Provider Notes (Signed)
[facility]    CSN: [id number] Arrival date & time: 08/06/18  1205     History   Chief Complaint Chief Complaint  Patient presents with  . Flu-Like Symptoms    HPI Arthur Ramos is a 22 y.o. male.   He presents today with 2 episodes of vomiting at work just prior to presentation.  Has had some loose stools earlier in the week.  A coworker had similar symptoms a few days ago.  No fever, no abdominal pain.  Not nauseous at present.  Does feel hungry. No urinary frequency, no dysuria.  Not coughing.  Denies chronic medical conditions that require trips to the doctor.  Not taking any medicines chronically. Not allergic to any medicines, has chosen for the last 6 years not to eat pork but this does not appear to be due to an allergy.  Family history notable for diabetes.  Frequent alcohol use.    HPI  History reviewed. No pertinent past medical history.  There are no active problems to display for this patient.   History reviewed. No pertinent surgical history.     Home Medications   Takes no meds regularly  Family History Family History  Problem Relation Age of Onset  . Healthy Mother   . Healthy Father     Social History Social History   Tobacco Use  . Smoking status: Current Every Day Smoker    Types: Cigarettes, Cigars  . Smokeless tobacco: Never Used  Substance Use Topics  . Alcohol use: Yes    Alcohol/week: 30.0 standard drinks    Types: 30 Standard drinks or equivalent per week  . Drug use: Yes    Types: Marijuana     Allergies   Patient has no known allergies.   Review of Systems Review of Systems  All other systems reviewed and are negative.    Physical Exam Triage Vital Signs ED Triage Vitals  Enc Vitals Group     BP 08/06/18 1217 133/85     Pulse Rate 08/06/18 1217 75     Resp 08/06/18 1217 16     Temp 08/06/18 1217 98.9 F (37.2 C)     Temp Source 08/06/18 1217 Oral     SpO2 08/06/18 1217 100 %     Weight --      Height --      Pain Score 08/06/18 1218 0     Pain Loc --    Updated Vital Signs BP 133/85 (BP Location: Left Arm)   Pulse 75   Temp 98.9 F (37.2 C) (Oral)   Resp 16   SpO2 100%  Physical Exam Vitals signs and nursing note reviewed.  Constitutional:      General: He is not in acute distress.    Comments: Alert, nicely groomed  HENT:     Head: Atraumatic.  Eyes:     Comments: Conjugate gaze, no eye redness/drainage  Neck:     Musculoskeletal: Neck supple.  Cardiovascular:     Rate and Rhythm: Normal rate and regular rhythm.  Pulmonary:     Effort: No respiratory distress.     Breath sounds: No wheezing or rhonchi.     Comments: Lungs clear, symmetric breath sounds  Abdominal:     General: Abdomen is flat. There is no distension.     Palpations: Abdomen is soft.     Tenderness: There is no abdominal tenderness. There is no right CVA tenderness, left CVA tenderness, guarding or rebound.  Musculoskeletal: Normal  range of motion.  Skin:    General: Skin is warm and dry.     Comments: No cyanosis  Neurological:     Mental Status: He is alert and oriented to person, place, and time.       Final Clinical Impressions(s) / UC Diagnoses   Final diagnoses:  Acute gastroenteritis     Discharge Instructions     Anticipate gradual improvement in stomach upset over the several days. Sip fluids and rest.  Diet as tolerated.  Note for work today, ok to return tomorrow.  Recheck for persistent vomiting, increasing abdominal pain, new fever >100.5, or if not improving as expected.   ED Prescriptions    None        Isa Rankin, MD 08/10/18 [phone]

## 2018-08-06 NOTE — Discharge Instructions (Addendum)
Anticipate gradual improvement in stomach upset over the several days. Sip fluids and rest.  Diet as tolerated.  Note for work today, ok to return tomorrow.  Recheck for persistent vomiting, increasing abdominal pain, new fever >100.5, or if not improving as expected.

## 2018-10-16 ENCOUNTER — Ambulatory Visit (HOSPITAL_COMMUNITY)
Admission: EM | Admit: 2018-10-16 | Discharge: 2018-10-16 | Disposition: A | Discharge status: Home / Self Care | Payer: 59 | Attending: Family Medicine | Admitting: Family Medicine

## 2018-10-16 ENCOUNTER — Other Ambulatory Visit: Payer: Self-pay

## 2018-10-16 ENCOUNTER — Encounter (HOSPITAL_COMMUNITY): Payer: Self-pay | Admitting: *Deleted

## 2018-10-16 DIAGNOSIS — N489 Disorder of penis, unspecified: Secondary | ICD-10-CM

## 2018-10-16 MED ORDER — VALACYCLOVIR HCL 1 G PO TABS: 1000.0000 mg | ORAL_TABLET | Freq: Two times a day (BID) | ORAL | 0 refills | Status: AC

## 2018-10-16 NOTE — ED Triage Notes (Signed)
C/o bumps on his penis, denies discharge.

## 2018-10-16 NOTE — Discharge Instructions (Signed)
I am concerned about possible herpes with this lesion to the penis, although it is no definitively herpes.  I have cultured the area. Will notify you of any positive findings and if any changes to treatment are needed.   You may start medication as testing pends.  Please use condoms to prevent spread.

## 2018-10-17 LAB — RPR: RPR Ser Ql: NONREACTIVE

## 2018-10-17 LAB — HIV ANTIBODY (ROUTINE TESTING W REFLEX): HIV Screen 4th Generation wRfx: NONREACTIVE

## 2018-10-17 NOTE — ED Provider Notes (Signed)
[facility]    CSN: [id number] Arrival date & time: 10/16/18  1754     History   Chief Complaint Chief Complaint  Patient presents with  . Exposure to STD    HPI Arthur Ramos is a 23 y.o. male.   [name] presents with concerns about lesions to his penis which he noticed just today. Is certain this is the first day of their presence. No pain, burning or drainage. No discharge. Denies any previous similar. He is sexually active with one partner and doesn't use condoms. Denies penile discharge, urinary frequency or dysuria. Denies history of STD's. He is concerned about possible std's. States he would like HIV/RPR screening, declines urine cytology for gc/chlamydia tonight. Without contributing medical history.      ROS per HPI.      History reviewed. No pertinent past medical history.  There are no active problems to display for this patient.   History reviewed. No pertinent surgical history.     Home Medications    Prior to Admission medications   Medication Sig Start Date End Date Taking? Authorizing Provider  valACYclovir (VALTREX) 1000 MG tablet Take 1 tablet (1,000 mg total) by mouth 2 (two) times daily for 10 days. 10/16/18 10/26/18  Georgetta Haber, NP    Family History Family History  Problem Relation Age of Onset  . Healthy Mother   . Healthy Father     Social History Social History   Tobacco Use  . Smoking status: Current Every Day Smoker    Types: Cigarettes, Cigars  . Smokeless tobacco: Never Used  Substance Use Topics  . Alcohol use: Yes    Alcohol/week: 30.0 standard drinks    Types: 30 Standard drinks or equivalent per week  . Drug use: Yes    Types: Marijuana     Allergies   Patient has no known allergies.   Review of Systems Review of Systems   Physical Exam Triage Vital Signs ED Triage Vitals  Enc Vitals Group     BP 10/16/18 1803 120/81     Pulse Rate 10/16/18 1803 87     Resp 10/16/18 1803 16     Temp  10/16/18 1803 98 F (36.7 C)     Temp Source 10/16/18 1803 Oral     SpO2 10/16/18 1803 100 %     Weight --      Height --      Head Circumference --      Peak Flow --      Pain Score 10/16/18 1804 0     Pain Loc --      Pain Edu? --      Excl. in GC? --    No data found.  Updated Vital Signs BP 120/81   Pulse 87   Temp 98 F (36.7 C) (Oral)   Resp 16   SpO2 100%   Visual Acuity Right Eye Distance:   Left Eye Distance:   Bilateral Distance:    Right Eye Near:   Left Eye Near:    Bilateral Near:     Physical Exam Exam conducted with a chaperone present.  Constitutional:      Appearance: He is well-developed.  Cardiovascular:     Rate and Rhythm: Normal rate and regular rhythm.  Pulmonary:     Effort: Pulmonary effort is normal.     Breath sounds: Normal breath sounds.  Genitourinary:    Penis: Lesions present.      Scrotum/Testes: Normal.  Comments: Skin toned linear cluster of raised lesions to posterior penile shaft; no active drainage or ulceration but with swabbing one area easily de-roofed; very little fluctuance; no redness; non tender Skin:    General: Skin is warm and dry.  Neurological:     Mental Status: He is alert and oriented to person, place, and time.      UC Treatments / Results  Labs (all labs ordered are listed, but only abnormal results are displayed) Labs Reviewed  HSV CULTURE AND TYPING  RPR  HIV ANTIBODY (ROUTINE TESTING W REFLEX)    EKG None  Radiology No results found.  Procedures Procedures (including critical care time)  Medications Ordered in UC Medications - No data to display  Initial Impression / Assessment and Plan / UC Course  I have reviewed the triage vital signs and the nursing notes.  Pertinent labs & imaging results that were available during my care of the patient were reviewed by me and considered in my medical decision making (see chart for details).     hsv culture obtained. Apparently this is  first day of lesions, they are currently without pain or itching. Unprotected sex with one partner. These are concerning for herpes with valtrex initiated. Will notify of any positive findings and if any changes to treatment are needed.  Encouraged safe sex practices. Patient verbalized understanding and agreeable to plan.    Final Clinical Impressions(s) / UC Diagnoses   Final diagnoses:  Penile lesion     Discharge Instructions     I am concerned about possible herpes with this lesion to the penis, although it is no definitively herpes.  I have cultured the area. Will notify you of any positive findings and if any changes to treatment are needed.   You may start medication as testing pends.  Please use condoms to prevent spread.     ED Prescriptions    Medication Sig Dispense Auth. Provider   valACYclovir (VALTREX) 1000 MG tablet Take 1 tablet (1,000 mg total) by mouth 2 (two) times daily for 10 days. 20 tablet Georgetta Haber, NP     Controlled Substance Prescriptions [place] Controlled Substance Registry consulted? Not Applicable   Georgetta Haber, NP 10/17/18 [phone]

## 2018-10-18 LAB — HSV CULTURE AND TYPING

## 2018-10-21 ENCOUNTER — Telehealth (HOSPITAL_COMMUNITY): Payer: Self-pay | Admitting: Emergency Medicine

## 2018-10-21 NOTE — Telephone Encounter (Signed)
Herpes screening is positive for HSV type 2, Pt needs education on Herpes and safe sex practices.   Patient contacted and made aware of all results, all questions answered.

## 2018-11-30 ENCOUNTER — Other Ambulatory Visit: Payer: Self-pay

## 2018-11-30 ENCOUNTER — Emergency Department (HOSPITAL_COMMUNITY)
Admission: EM | Admit: 2018-11-30 | Discharge: 2018-11-30 | Disposition: A | Discharge status: Home / Self Care | Payer: 59 | Attending: Emergency Medicine | Admitting: Emergency Medicine

## 2018-11-30 ENCOUNTER — Encounter (HOSPITAL_COMMUNITY): Payer: Self-pay | Admitting: Emergency Medicine

## 2018-11-30 DIAGNOSIS — F17290 Nicotine dependence, other tobacco product, uncomplicated: Secondary | ICD-10-CM | POA: Insufficient documentation

## 2018-11-30 DIAGNOSIS — F17210 Nicotine dependence, cigarettes, uncomplicated: Secondary | ICD-10-CM | POA: Diagnosis not present

## 2018-11-30 DIAGNOSIS — R05 Cough: Secondary | ICD-10-CM | POA: Diagnosis present

## 2018-11-30 DIAGNOSIS — J9801 Acute bronchospasm: Secondary | ICD-10-CM | POA: Insufficient documentation

## 2018-11-30 MED ORDER — ALBUTEROL SULFATE HFA 108 (90 BASE) MCG/ACT IN AERS
8.0000 | INHALATION_SPRAY | RESPIRATORY_TRACT | Status: AC
  Administered 2018-11-30: 8 via RESPIRATORY_TRACT
  Filled 2018-11-30: qty 6.7

## 2018-11-30 MED ORDER — LORATADINE 10 MG PO TABS
10.0000 mg | ORAL_TABLET | Freq: Once | ORAL | Status: AC
  Administered 2018-11-30: 10 mg via ORAL
  Filled 2018-11-30: qty 1

## 2018-11-30 MED ORDER — DEXAMETHASONE 4 MG PO TABS
10.0000 mg | ORAL_TABLET | Freq: Once | ORAL | Status: AC
  Administered 2018-11-30: 10 mg via ORAL
  Filled 2018-11-30: qty 2

## 2018-11-30 MED ORDER — AEROCHAMBER Z-STAT PLUS/MEDIUM MISC
1.0000 | Freq: Once | Status: AC
  Administered 2018-11-30: 05:00:00 1
  Filled 2018-11-30: qty 1

## 2018-11-30 NOTE — ED Notes (Signed)
Pt been taking a number of OTC's without relief.

## 2018-11-30 NOTE — ED Provider Notes (Signed)
[facility]-EMERGENCY DEPT Provider Note   CSN: [id number] Arrival date & time: 11/30/18  0326    History   Chief Complaint Chief Complaint  Patient presents with  . Cough    HPI Arthur Ramos is a 23 y.o. male.     23 year old male with no significant past medical history presents to the emergency department for evaluation of cough over the past several days.  Also notes shortness of breath.  Symptoms have been constant, worsening since onset.  Triage note references history of asthma, though patient denies this during my encounter with him.  States that he has never used an inhaler.  He works in the [employer] and is around [employer]  Does have a history of seasonal allergies in the springtime; usually no issues in the fall.  He has not had any known sick contacts and denies fevers, rhinorrhea, sore throat.  The history is provided by the patient. No language interpreter was used.  Cough    History reviewed. No pertinent past medical history.  There are no active problems to display for this patient.   History reviewed. No pertinent surgical history.      Home Medications    Prior to Admission medications   Medication Sig Start Date End Date Taking? Authorizing Provider  acetaminophen (TYLENOL) 500 MG tablet Take 1,000 mg by mouth every 6 (six) hours as needed for moderate pain.   Yes [provider]    Family History Family History  Problem Relation Age of Onset  . Healthy Mother   . Healthy Father     Social History Social History   Tobacco Use  . Smoking status: Current Every Day Smoker    Types: Cigarettes, Cigars  . Smokeless tobacco: Never Used  Substance Use Topics  . Alcohol use: Yes    Alcohol/week: 30.0 standard drinks    Types: 30 Standard drinks or equivalent per week  . Drug use: Yes    Types: Marijuana     Allergies   Patient has no known allergies.   Review of Systems Review of Systems   Respiratory: Positive for cough.   Ten systems reviewed and are negative for acute change, except as noted in the HPI.    Physical Exam Updated Vital Signs BP (!) 148/88 (BP Location: Right Arm)   Pulse 72   Temp 97.8 F (36.6 C) (Oral)   Resp 17   Ht 5\' 9"  (1.753 m)   Wt 72.6 kg   SpO2 98%   BMI 23.63 kg/m   Physical Exam Vitals signs and nursing note reviewed.  Constitutional:      General: He is not in acute distress.    Appearance: He is well-developed. He is not diaphoretic.     Comments: Nontoxic appearing  HENT:     Head: Normocephalic and atraumatic.     Nose: Congestion present. No rhinorrhea.  Eyes:     General: No scleral icterus.    Conjunctiva/sclera: Conjunctivae normal.  Neck:     Musculoskeletal: Normal range of motion.  Cardiovascular:     Rate and Rhythm: Normal rate and regular rhythm.     Pulses: Normal pulses.  Pulmonary:     Effort: Pulmonary effort is normal.     Breath sounds: Wheezing present. No rhonchi or rales.     Comments: Speech is mildly truncated.  There is diffuse inspiratory and expiratory wheezing.  No rales or rhonchi.  Chest expansion symmetric.  Patient does appear dyspneic  without tachypnea.  No respiratory distress. Musculoskeletal: Normal range of motion.  Skin:    General: Skin is warm and dry.     Coloration: Skin is not pale.     Findings: No erythema or rash.  Neurological:     Mental Status: He is alert and oriented to person, place, and time.     Coordination: Coordination normal.  Psychiatric:        Behavior: Behavior normal.      ED Treatments / Results  Labs (all labs ordered are listed, but only abnormal results are displayed) Labs Reviewed - No data to display  EKG None  Radiology No results found.  Procedures Procedures (including critical care time)  Medications Ordered in ED Medications  albuterol (PROVENTIL HFA;VENTOLIN HFA) 108 (90 Base) MCG/ACT inhaler 8 puff (8 puffs Inhalation Given  11/30/18 0432)  loratadine (CLARITIN) tablet 10 mg (10 mg Oral Given 11/30/18 0432)  dexamethasone (DECADRON) tablet 10 mg (10 mg Oral Given 11/30/18 0432)  aerochamber Z-Stat Plus/medium 1 each (1 each Other Given 11/30/18 0433)    5:19 AM Lungs clear to auscultation on repeat assessment.  Patient feeling better.  No signs of respiratory distress.  No hypoxia.  Given that patient has no fever, will cancel chest x-ray at this time.  He has been instructed to return for new or concerning symptoms.   Initial Impression / Assessment and Plan / ED Course  I have reviewed the triage vital signs and the nursing notes.  Pertinent labs & imaging results that were available during my care of the patient were reviewed by me and considered in my medical decision making (see chart for details).        Patient presenting for cough. Noted to be wheezing on exam c/w bronchospasm suspected 2/2 seasonal allergies. Lungs sounds improved with albuterol inhaler. Patient will be discharged with instructions for symptomatic treatment.  He verbalizes understanding and is agreeable with plan. Patient is hemodynamically stable and in NAD prior to discharge.  Arthur Ramos was evaluated in Emergency Department on 11/30/2018 for the symptoms described in the history of present illness. He was evaluated in the context of the global COVID-19 pandemic, which necessitated consideration that the patient might be at risk for infection with the SARS-CoV-2 virus that causes COVID-19. Institutional protocols and algorithms that pertain to the evaluation of patients at risk for COVID-19 are in a state of rapid change based on information released by regulatory bodies including the CDC and federal and state organizations. These policies and algorithms were followed during the patient's care in the ED.   Final Clinical Impressions(s) / ED Diagnoses   Final diagnoses:  Acute bronchospasm    ED Discharge Orders    None        Antony MaduraHumes, Roshell Brigham, PA-C 11/30/18 [id number]    Tegeler, Canary Brimhristopher J, MD 11/30/18 [phone]

## 2018-11-30 NOTE — Discharge Instructions (Signed)
Use 2 puffs of an albuterol inhaler every 4-6 hours for management of wheezing, cough, shortness of breath. Use daily Claritin or Zyrtec.  These can be purchased over-the-counter at your local pharmacy.  Return to the ED for new or concerning symptoms.

## 2018-11-30 NOTE — ED Triage Notes (Signed)
Pt reports cough for the last several days with hx of asthma. Pt denies being around anyone dx with COVID.

## 2019-05-18 ENCOUNTER — Emergency Department (HOSPITAL_COMMUNITY)
Admission: EM | Admit: 2019-05-18 | Discharge: 2019-05-18 | Disposition: A | Discharge status: Home / Self Care | Payer: 59 | Attending: Emergency Medicine | Admitting: Emergency Medicine

## 2019-05-18 ENCOUNTER — Other Ambulatory Visit: Payer: Self-pay

## 2019-05-18 ENCOUNTER — Encounter (HOSPITAL_COMMUNITY): Payer: Self-pay | Admitting: Emergency Medicine

## 2019-05-18 DIAGNOSIS — F1290 Cannabis use, unspecified, uncomplicated: Secondary | ICD-10-CM | POA: Diagnosis not present

## 2019-05-18 DIAGNOSIS — X58XXXA Exposure to other specified factors, initial encounter: Secondary | ICD-10-CM | POA: Diagnosis not present

## 2019-05-18 DIAGNOSIS — F17210 Nicotine dependence, cigarettes, uncomplicated: Secondary | ICD-10-CM | POA: Insufficient documentation

## 2019-05-18 DIAGNOSIS — Y939 Activity, unspecified: Secondary | ICD-10-CM | POA: Diagnosis not present

## 2019-05-18 DIAGNOSIS — S46912A Strain of unspecified muscle, fascia and tendon at shoulder and upper arm level, left arm, initial encounter: Secondary | ICD-10-CM | POA: Diagnosis not present

## 2019-05-18 DIAGNOSIS — F319 Bipolar disorder, unspecified: Secondary | ICD-10-CM | POA: Insufficient documentation

## 2019-05-18 DIAGNOSIS — Y999 Unspecified external cause status: Secondary | ICD-10-CM | POA: Diagnosis not present

## 2019-05-18 DIAGNOSIS — Y929 Unspecified place or not applicable: Secondary | ICD-10-CM | POA: Diagnosis not present

## 2019-05-18 DIAGNOSIS — S4992XA Unspecified injury of left shoulder and upper arm, initial encounter: Secondary | ICD-10-CM | POA: Diagnosis present

## 2019-05-18 HISTORY — DX: Anxiety disorder, unspecified: F41.9

## 2019-05-18 HISTORY — DX: Bipolar disorder, unspecified: F31.9

## 2019-05-18 NOTE — ED Notes (Signed)
Refused vitals 

## 2019-05-18 NOTE — ED Notes (Signed)
Left shoulder xray offered Mr Tall has refused

## 2019-05-18 NOTE — ED Triage Notes (Signed)
Pt reports left shoulder pain after arrest by [employer]. No deformity noted pulses ulnar radial and brachial +3

## 2019-05-18 NOTE — ED Provider Notes (Signed)
[facility]-EMERGENCY DEPT Provider Note   CSN: [id number] Arrival date & time: 05/18/19  0209     History   Chief Complaint Chief Complaint  Patient presents with  . Shoulder Pain    HPI Arthur Ramos is a 23 y.o. male.     Brought the emergency department by police for evaluation of left shoulder pain.  Patient was arrested tonight on suspicion of DUI and apparently injured his shoulder during the arrest.  He is complaining of pain with movement of the left shoulder.  He denies head injury, neck pain, back pain, chest pain, abdominal pain.  No numbness, tingling or weakness.     Past Medical History:  Diagnosis Date  . Anxiety   . Bipolar 1 disorder (HCC)     There are no active problems to display for this patient.   History reviewed. No pertinent surgical history.      Home Medications    Prior to Admission medications   Medication Sig Start Date End Date Taking? Authorizing Provider  acetaminophen (TYLENOL) 500 MG tablet Take 1,000 mg by mouth every 6 (six) hours as needed for moderate pain.    [provider]    Family History Family History  Problem Relation Age of Onset  . Healthy Mother   . Healthy Father     Social History Social History   Tobacco Use  . Smoking status: Current Every Day Smoker    Types: Cigarettes, Cigars  . Smokeless tobacco: Never Used  Substance Use Topics  . Alcohol use: Yes    Alcohol/week: 30.0 standard drinks    Types: 30 Standard drinks or equivalent per week  . Drug use: Yes    Types: Marijuana     Allergies   Patient has no known allergies.   Review of Systems Review of Systems  Musculoskeletal: Positive for arthralgias.  All other systems reviewed and are negative.    Physical Exam Updated Vital Signs BP (!) 133/93 (BP Location: Left Arm)   Pulse 95   Temp 97.9 F (36.6 C) (Oral)   Resp 16   Ht 5\' 9"  (1.753 m)   Wt 68 kg   SpO2 98%   BMI 22.15 kg/m    Physical Exam Vitals signs and nursing note reviewed.  Constitutional:      General: He is not in acute distress.    Appearance: Normal appearance. He is well-developed.  HENT:     Head: Normocephalic and atraumatic.     Right Ear: Hearing normal.     Left Ear: Hearing normal.     Nose: Nose normal.  Eyes:     Conjunctiva/sclera: Conjunctivae normal.     Pupils: Pupils are equal, round, and reactive to light.  Neck:     Musculoskeletal: Normal range of motion and neck supple.  Cardiovascular:     Rate and Rhythm: Regular rhythm.     Heart sounds: S1 normal and S2 normal. No murmur. No friction rub. No gallop.   Pulmonary:     Effort: Pulmonary effort is normal. No respiratory distress.     Breath sounds: Normal breath sounds.  Chest:     Chest wall: No tenderness.  Abdominal:     General: Bowel sounds are normal.     Palpations: Abdomen is soft.     Tenderness: There is no abdominal tenderness. There is no guarding or rebound. Negative signs include Murphy's sign and McBurney's sign.     Hernia: No hernia is present.  Musculoskeletal: Normal range of motion.     Left shoulder: He exhibits tenderness. He exhibits normal range of motion, no swelling and no deformity.  Skin:    General: Skin is warm and dry.     Findings: No rash.  Neurological:     Mental Status: He is alert and oriented to person, place, and time.     GCS: GCS eye subscore is 4. GCS verbal subscore is 5. GCS motor subscore is 6.     Cranial Nerves: No cranial nerve deficit.     Sensory: No sensory deficit.     Coordination: Coordination normal.  Psychiatric:        Speech: Speech normal.        Behavior: Behavior normal.        Thought Content: Thought content normal.      ED Treatments / Results  Labs (all labs ordered are listed, but only abnormal results are displayed) Labs Reviewed - No data to display  EKG None  Radiology No results found.  Procedures Procedures (including critical care  time)  Medications Ordered in ED Medications - No data to display   Initial Impression / Assessment and Plan / ED Course  I have reviewed the triage vital signs and the nursing notes.  Pertinent labs & imaging results that were available during my care of the patient were reviewed by me and considered in my medical decision making (see chart for details).        Examination reveals mild tenderness of the left shoulder without deformity, normal range of motion.  Clavicle nontender.  Offered x-ray but he declines.  No other injury on exam.  Discharged into care of police.  Final Clinical Impressions(s) / ED Diagnoses   Final diagnoses:  Shoulder strain, left, initial encounter    ED Discharge Orders    None       Quinlan Vollmer, Gwenyth Allegra, MD 05/18/19 [phone]

## 2019-06-06 ENCOUNTER — Other Ambulatory Visit: Payer: Self-pay

## 2019-06-06 ENCOUNTER — Ambulatory Visit (HOSPITAL_COMMUNITY)
Admission: EM | Admit: 2019-06-06 | Discharge: 2019-06-06 | Disposition: A | Discharge status: Home / Self Care | Payer: 59 | Attending: Family Medicine | Admitting: Family Medicine

## 2019-06-06 ENCOUNTER — Encounter (HOSPITAL_COMMUNITY): Payer: Self-pay

## 2019-06-06 DIAGNOSIS — Z20822 Contact with and (suspected) exposure to covid-19: Secondary | ICD-10-CM

## 2019-06-06 DIAGNOSIS — R110 Nausea: Secondary | ICD-10-CM

## 2019-06-06 DIAGNOSIS — R509 Fever, unspecified: Secondary | ICD-10-CM | POA: Insufficient documentation

## 2019-06-06 DIAGNOSIS — J069 Acute upper respiratory infection, unspecified: Secondary | ICD-10-CM | POA: Diagnosis not present

## 2019-06-06 DIAGNOSIS — Z20828 Contact with and (suspected) exposure to other viral communicable diseases: Secondary | ICD-10-CM | POA: Diagnosis not present

## 2019-06-06 NOTE — Discharge Instructions (Signed)
Drink plenty of fluids Take tylenol for pain or fever May take cold medicine if needed Quarantine at home until covid test is available     Person Under Monitoring Name: Arthur Ramos  Location: [address] [place] [address]   Infection Prevention Recommendations for Individuals Confirmed to have, or Being Evaluated for, 2019 Novel Coronavirus (COVID-19) Infection Who Receive Care at Home  Individuals who are confirmed to have, or are being evaluated for, COVID-19 should follow the prevention steps below until a healthcare provider or local or state health department says they can return to normal activities.  Stay home except to get medical care You should restrict activities outside your home, except for getting medical care. Do not go to work, school, or Ramos areas, and do not use Ramos transportation or taxis.  Call ahead before visiting your doctor Before your medical appointment, call the healthcare provider and tell them that you have, or are being evaluated for, COVID-19 infection. This will help the healthcare providers office take steps to keep other people from getting infected. Ask your healthcare provider to call the local or state health department.  Monitor your symptoms Seek prompt medical attention if your illness is worsening (e.g., difficulty breathing). Before going to your medical appointment, call the healthcare provider and tell them that you have, or are being evaluated for, COVID-19 infection. Ask your healthcare provider to call the local or state health department.  Wear a facemask You should wear a facemask that covers your nose and mouth when you are in the same room with other people and when you visit a healthcare provider. People who live with or visit you should also wear a facemask while they are in the same room with you.  Separate yourself from other people in your home As much as possible, you should stay in a different room  from other people in your home. Also, you should use a separate bathroom, if available.  Avoid sharing household items You should not share dishes, drinking glasses, cups, eating utensils, towels, bedding, or other items with other people in your home. After using these items, you should wash them thoroughly with soap and water.  Cover your coughs and sneezes Cover your mouth and nose with a tissue when you cough or sneeze, or you can cough or sneeze into your sleeve. Throw used tissues in a lined trash can, and immediately wash your hands with soap and water for at least 20 seconds or use an alcohol-based hand rub.  Wash your [employer] your hands often and thoroughly with soap and water for at least 20 seconds. You can use an alcohol-based hand sanitizer if soap and water are not available and if your hands are not visibly dirty. Avoid touching your eyes, nose, and mouth with unwashed hands.   Prevention Steps for Caregivers and Household Members of Individuals Confirmed to have, or Being Evaluated for, COVID-19 Infection Being Cared for in the Home  If you live with, or provide care at home for, a person confirmed to have, or being evaluated for, COVID-19 infection please follow these guidelines to prevent infection:  Follow healthcare providers instructions Make sure that you understand and can help the patient follow any healthcare provider instructions for all care.  Provide for the patients basic needs You should help the patient with basic needs in the home and provide support for getting groceries, prescriptions, and other personal needs.  Monitor the patients symptoms If they are getting sicker, call  his or her medical provider and tell them that the patient has, or is being evaluated for, COVID-19 infection. This will help the healthcare providers office take steps to keep other people from getting infected. Ask the healthcare provider to call the local or state  health department.  Limit the number of people who have contact with the patient If possible, have only one caregiver for the patient. Other household members should stay in another home or place of residence. If this is not possible, they should stay in another room, or be separated from the patient as much as possible. Use a separate bathroom, if available. Restrict visitors who do not have an essential need to be in the home.  Keep older adults, very young children, and other sick people away from the patient Keep older adults, very young children, and those who have compromised immune systems or chronic health conditions away from the patient. This includes people with chronic heart, lung, or kidney conditions, diabetes, and cancer.  Ensure good ventilation Make sure that shared spaces in the home have good air flow, such as from an air conditioner or an opened window, weather permitting.  Wash your hands often Wash your hands often and thoroughly with soap and water for at least 20 seconds. You can use an alcohol based hand sanitizer if soap and water are not available and if your hands are not visibly dirty. Avoid touching your eyes, nose, and mouth with unwashed hands. Use disposable paper towels to dry your hands. If not available, use dedicated cloth towels and replace them when they become wet.  Wear a facemask and gloves Wear a disposable facemask at all times in the room and gloves when you touch or have contact with the patients blood, body fluids, and/or secretions or excretions, such as sweat, saliva, sputum, nasal mucus, vomit, urine, or feces.  Ensure the mask fits over your nose and mouth tightly, and do not touch it during use. Throw out disposable facemasks and gloves after using them. Do not reuse. Wash your hands immediately after removing your facemask and gloves. If your personal clothing becomes contaminated, carefully remove clothing and launder. Wash your hands  after handling contaminated clothing. Place all used disposable facemasks, gloves, and other waste in a lined container before disposing them with other household waste. Remove gloves and wash your hands immediately after handling these items.  Do not share dishes, glasses, or other household items with the patient Avoid sharing household items. You should not share dishes, drinking glasses, cups, eating utensils, towels, bedding, or other items with a patient who is confirmed to have, or being evaluated for, COVID-19 infection. After the person uses these items, you should wash them thoroughly with soap and water.  Wash laundry thoroughly Immediately remove and wash clothes or bedding that have blood, body fluids, and/or secretions or excretions, such as sweat, saliva, sputum, nasal mucus, vomit, urine, or feces, on them. Wear gloves when handling laundry from the patient. Read and follow directions on labels of laundry or clothing items and detergent. In general, wash and dry with the warmest temperatures recommended on the label.  Clean all areas the individual has used often Clean all touchable surfaces, such as counters, tabletops, doorknobs, bathroom fixtures, toilets, phones, keyboards, tablets, and bedside tables, every day. Also, clean any surfaces that may have blood, body fluids, and/or secretions or excretions on them. Wear gloves when cleaning surfaces the patient has come in contact with. Use a diluted bleach solution (e.g.,  dilute bleach with 1 part bleach and 10 parts water) or a household disinfectant with a label that says EPA-registered for coronaviruses. To make a bleach solution at home, add 1 tablespoon of bleach to 1 quart (4 cups) of water. For a larger supply, add  cup of bleach to 1 gallon (16 cups) of water. Read labels of cleaning products and follow recommendations provided on product labels. Labels contain instructions for safe and effective use of the cleaning product  including precautions you should take when applying the product, such as wearing gloves or eye protection and making sure you have good ventilation during use of the product. Remove gloves and wash hands immediately after cleaning.  Monitor yourself for signs and symptoms of illness Caregivers and household members are considered close contacts, should monitor their health, and will be asked to limit movement outside of the home to the extent possible. Follow the monitoring steps for close contacts listed on the symptom monitoring form.   ? If you have additional questions, contact your local health department or call the epidemiologist on call at [phone] (available 24/7). ? This guidance is subject to change. For the most up-to-date guidance from [facility], please refer to their website: [email or web address]

## 2019-06-06 NOTE — ED Provider Notes (Signed)
[facility]    CSN: [id number] Arrival date & time: 06/06/19  1425      History   Chief Complaint Chief Complaint  Patient presents with  . Fever  . Emesis  . Nausea    HPI Arthur Ramos is a 23 y.o. male.   HPI  Patient states he has been sick since last week.  He had a runny nose and sore throat on Thursday and Friday.  On Saturday he started vomiting, terrible fatigue, goosebumps, chills, and he suspected he had a fever.  He states Sunday he had a couple episodes of diarrhea.  He has been coughing and feels like he has some congestion in his chest.  He tried to go to work today, started coughing, was sent home to have testing for coronavirus.  No known sick exposures.  He admits that he is not careful about social distancing.  Past Medical History:  Diagnosis Date  . Anxiety   . Bipolar 1 disorder (HCC)     There are no active problems to display for this patient.   History reviewed. No pertinent surgical history.     Home Medications    Prior to Admission medications   Medication Sig Start Date End Date Taking? Authorizing Provider  acetaminophen (TYLENOL) 500 MG tablet Take 1,000 mg by mouth every 6 (six) hours as needed for moderate pain.    [provider]    Family History Family History  Problem Relation Age of Onset  . Healthy Mother   . Healthy Father     Social History Social History   Tobacco Use  . Smoking status: Current Every Day Smoker    Types: Cigarettes, Cigars  . Smokeless tobacco: Never Used  Substance Use Topics  . Alcohol use: Yes    Alcohol/week: 30.0 standard drinks    Types: 30 Standard drinks or equivalent per week  . Drug use: Yes    Types: Marijuana     Allergies   Patient has no known allergies.   Review of Systems Review of Systems  Constitutional: Positive for appetite change, diaphoresis, fatigue and fever. Negative for chills.  HENT: Positive for rhinorrhea and sore throat. Negative  for ear pain.   Eyes: Negative for pain and visual disturbance.  Respiratory: Positive for cough. Negative for shortness of breath.   Cardiovascular: Negative for chest pain and palpitations.  Gastrointestinal: Positive for diarrhea and nausea. Negative for abdominal pain and vomiting.  Genitourinary: Negative for dysuria and hematuria.  Musculoskeletal: Negative for arthralgias and back pain.  Skin: Negative for color change and rash.  Neurological: Negative for seizures and syncope.  All other systems reviewed and are negative.    Physical Exam Triage Vital Signs ED Triage Vitals [06/06/19 1445]  Enc Vitals Group     BP 116/75     Pulse Rate 75     Resp 18     Temp 98.7 F (37.1 C)     Temp Source Oral     SpO2 100 %     Weight      Height      Head Circumference      Peak Flow      Pain Score 4     Pain Loc      Pain Edu?      Excl. in GC?    No data found.  Updated Vital Signs BP 116/75 (BP Location: Left Arm)   Pulse 75   Temp 98.7 F (37.1 C) (  Oral)   Resp 18   SpO2 100%   Physical Exam Constitutional:      General: He is not in acute distress.    Appearance: He is well-developed. He is not ill-appearing.     Comments: Thin.  No distress  HENT:     Head: Normocephalic and atraumatic.     Right Ear: Tympanic membrane and ear canal normal.     Left Ear: Tympanic membrane and ear canal normal.     Nose: Nose normal. No congestion.     Mouth/Throat:     Mouth: Mucous membranes are moist.     Pharynx: No posterior oropharyngeal erythema.  Eyes:     Conjunctiva/sclera: Conjunctivae normal.     Pupils: Pupils are equal, round, and reactive to light.  Neck:     Musculoskeletal: Normal range of motion.  Cardiovascular:     Rate and Rhythm: Normal rate.     Heart sounds: Normal heart sounds.  Pulmonary:     Effort: Pulmonary effort is normal. No respiratory distress.     Breath sounds: Normal breath sounds. No wheezing or rales.     Comments: Lungs are  clear Abdominal:     General: There is no distension.     Palpations: Abdomen is soft.  Musculoskeletal: Normal range of motion.  Skin:    General: Skin is warm and dry.  Neurological:     Mental Status: He is alert.  Psychiatric:        Mood and Affect: Mood normal.        Behavior: Behavior normal.      UC Treatments / Results  Labs (all labs ordered are listed, but only abnormal results are displayed) Labs Reviewed  NOVEL CORONAVIRUS, NAA (HOSP ORDER, SEND-OUT TO REF LAB; TAT 18-24 HRS)    EKG   Radiology No results found.  Procedures Procedures (including critical care time)  Medications Ordered in UC Medications - No data to display  Initial Impression / Assessment and Plan / UC Course  I have reviewed the triage vital signs and the nursing notes.  Pertinent labs & imaging results that were available during my care of the patient were reviewed by me and considered in my medical decision making (see chart for details).     Discussed coronavirus testing.  He has a virus of some sort, coronavirus is possible but not typical.  He is going to quarantine until this test result is available.  He is advised to get my chart so he can get a test that is negative.  Otherwise he can call the office in a couple days. Final Clinical Impressions(s) / UC Diagnoses   Final diagnoses:  Viral upper respiratory tract infection  Fever, unspecified  Suspected COVID-19 virus infection     Discharge Instructions     Drink plenty of fluids Take tylenol for pain or fever May take cold medicine if needed Quarantine at home until covid test is available     Person Under Monitoring Name: Arthur Ramos  Location: [address] [place] [address]   Infection Prevention Recommendations for Individuals Confirmed to have, or Being Evaluated for, 2019 Novel Coronavirus (COVID-19) Infection Who Receive Care at Home  Individuals who are confirmed to have, or are being  evaluated for, COVID-19 should follow the prevention steps below until a healthcare provider or local or state health department says they can return to normal activities.  Stay home except to get medical care You should restrict activities outside  your home, except for getting medical care. Do not go to work, school, or Ramos areas, and do not use [place] transportation or taxis.  Call ahead before visiting your doctor Before your medical appointment, call the healthcare provider and tell them that you have, or are being evaluated for, COVID-19 infection. This will help the healthcare provider's office take steps to keep other people from getting infected. Ask your healthcare provider to call the local or state health department.  Monitor your symptoms Seek prompt medical attention if your illness is worsening (e.g., difficulty breathing). Before going to your medical appointment, call the healthcare provider and tell them that you have, or are being evaluated for, COVID-19 infection. Ask your healthcare provider to call the local or state health department.  Wear a facemask You should wear a facemask that covers your nose and mouth when you are in the same room with other people and when you visit a healthcare provider. People who live with or visit you should also wear a facemask while they are in the same room with you.  Separate yourself from other people in your home As much as possible, you should stay in a different room from other people in your home. Also, you should use a separate bathroom, if available.  Avoid sharing household items You should not share dishes, drinking glasses, cups, eating utensils, towels, bedding, or other items with other people in your home. After using these items, you should wash them thoroughly with soap and water.  Cover your coughs and sneezes Cover your mouth and nose with a tissue when you cough or sneeze, or you can cough or sneeze into your  sleeve. Throw used tissues in a lined trash can, and immediately wash your hands with soap and water for at least 20 seconds or use an alcohol-based hand rub.  Wash your [employer] Wash your hands often and thoroughly with soap and water for at least 20 seconds. You can use an alcohol-based hand sanitizer if soap and water are not available and if your hands are not visibly dirty. Avoid touching your eyes, nose, and mouth with unwashed hands.   Prevention Steps for Caregivers and Household Members of Individuals Confirmed to have, or Being Evaluated for, COVID-19 Infection Being Cared for in the Home  If you live with, or provide care at home for, a person confirmed to have, or being evaluated for, COVID-19 infection please follow these guidelines to prevent infection:  Follow healthcare provider's instructions Make sure that you understand and can help the patient follow any healthcare provider instructions for all care.  Provide for the patient's basic needs You should help the patient with basic needs in the home and provide support for getting groceries, prescriptions, and other personal needs.  Monitor the patient's symptoms If they are getting sicker, call his or her medical provider and tell them that the patient has, or is being evaluated for, COVID-19 infection. This will help the healthcare provider's office take steps to keep other people from getting infected. Ask the healthcare provider to call the local or state health department.  Limit the number of people who have contact with the patient  If possible, have only one caregiver for the patient.  Other household members should stay in another home or place of residence. If this is not possible, they should stay  in another room, or be separated from the patient as much as possible. Use a separate bathroom, if available.  Restrict visitors who do not  have an essential need to be in the home.  Keep older adults, very  young children, and other sick people away from the patient Keep older adults, very young children, and those who have compromised immune systems or chronic health conditions away from the patient. This includes people with chronic heart, lung, or kidney conditions, diabetes, and cancer.  Ensure good ventilation Make sure that shared spaces in the home have good air flow, such as from an air conditioner or an opened window, weather permitting.  Wash your hands often  Wash your hands often and thoroughly with soap and water for at least 20 seconds. You can use an alcohol based hand sanitizer if soap and water are not available and if your hands are not visibly dirty.  Avoid touching your eyes, nose, and mouth with unwashed hands.  Use disposable paper towels to dry your hands. If not available, use dedicated cloth towels and replace them when they become wet.  Wear a facemask and gloves  Wear a disposable facemask at all times in the room and gloves when you touch or have contact with the patient's blood, body fluids, and/or secretions or excretions, such as sweat, saliva, sputum, nasal mucus, vomit, urine, or feces.  Ensure the mask fits over your nose and mouth tightly, and do not touch it during use.  Throw out disposable facemasks and gloves after using them. Do not reuse.  Wash your hands immediately after removing your facemask and gloves.  If your personal clothing becomes contaminated, carefully remove clothing and launder. Wash your hands after handling contaminated clothing.  Place all used disposable facemasks, gloves, and other waste in a lined container before disposing them with other household waste.  Remove gloves and wash your hands immediately after handling these items.  Do not share dishes, glasses, or other household items with the patient  Avoid sharing household items. You should not share dishes, drinking glasses, cups, eating utensils, towels, bedding, or other  items with a patient who is confirmed to have, or being evaluated for, COVID-19 infection.  After the person uses these items, you should wash them thoroughly with soap and water.  Wash laundry thoroughly  Immediately remove and wash clothes or bedding that have blood, body fluids, and/or secretions or excretions, such as sweat, saliva, sputum, nasal mucus, vomit, urine, or feces, on them.  Wear gloves when handling laundry from the patient.  Read and follow directions on labels of laundry or clothing items and detergent. In general, wash and dry with the warmest temperatures recommended on the label.  Clean all areas the individual has used often  Clean all touchable surfaces, such as counters, tabletops, doorknobs, bathroom fixtures, toilets, phones, keyboards, tablets, and bedside tables, every day. Also, clean any surfaces that may have blood, body fluids, and/or secretions or excretions on them.  Wear gloves when cleaning surfaces the patient has come in contact with.  Use a diluted bleach solution (e.g., dilute bleach with 1 part bleach and 10 parts water) or a household disinfectant with a label that says EPA-registered for coronaviruses. To make a bleach solution at home, add 1 tablespoon of bleach to 1 quart (4 cups) of water. For a larger supply, add  cup of bleach to 1 gallon (16 cups) of water.  Read labels of cleaning products and follow recommendations provided on product labels. Labels contain instructions for safe and effective use of the cleaning product including precautions you should take when applying the product, such as wearing gloves  or eye protection and making sure you have good ventilation during use of the product.  Remove gloves and wash hands immediately after cleaning.  Monitor yourself for signs and symptoms of illness Caregivers and household members are considered close contacts, should monitor their health, and will be asked to limit movement outside of  the home to the extent possible. Follow the monitoring steps for close contacts listed on the symptom monitoring form.   ? If you have additional questions, contact your local health department or call the epidemiologist on call at [phone] (available 24/7). ? This guidance is subject to change. For the most up-to-date guidance from [facility], please refer to their website: [email or web address]    ED Prescriptions    None     PDMP not reviewed this encounter.   Raylene Everts, MD 06/06/19 [phone]

## 2019-06-06 NOTE — ED Triage Notes (Signed)
Pt present fever, nausea and vomiting with diarrhea. Symptoms started yesterday.

## 2019-06-09 LAB — NOVEL CORONAVIRUS, NAA (HOSP ORDER, SEND-OUT TO REF LAB; TAT 18-24 HRS): SARS-CoV-2, NAA: NOT DETECTED

## 2019-07-17 ENCOUNTER — Emergency Department (HOSPITAL_COMMUNITY): Payer: 59

## 2019-07-17 ENCOUNTER — Encounter (HOSPITAL_COMMUNITY): Payer: Self-pay | Admitting: Emergency Medicine

## 2019-07-17 ENCOUNTER — Emergency Department (HOSPITAL_COMMUNITY)
Admission: EM | Admit: 2019-07-17 | Discharge: 2019-07-17 | Discharge status: Against Medical Advice | Payer: 59 | Source: Home / Self Care | Attending: Emergency Medicine | Admitting: Emergency Medicine

## 2019-07-17 ENCOUNTER — Emergency Department (HOSPITAL_COMMUNITY)
Admission: EM | Admit: 2019-07-17 | Discharge: 2019-07-17 | Disposition: A | Discharge status: Home / Self Care | Payer: 59 | Attending: Emergency Medicine | Admitting: Emergency Medicine

## 2019-07-17 ENCOUNTER — Encounter (HOSPITAL_COMMUNITY): Payer: Self-pay

## 2019-07-17 ENCOUNTER — Other Ambulatory Visit: Payer: Self-pay

## 2019-07-17 DIAGNOSIS — Y9289 Other specified places as the place of occurrence of the external cause: Secondary | ICD-10-CM | POA: Insufficient documentation

## 2019-07-17 DIAGNOSIS — S8254XA Nondisplaced fracture of medial malleolus of right tibia, initial encounter for closed fracture: Secondary | ICD-10-CM | POA: Diagnosis not present

## 2019-07-17 DIAGNOSIS — S27321A Contusion of lung, unilateral, initial encounter: Secondary | ICD-10-CM | POA: Insufficient documentation

## 2019-07-17 DIAGNOSIS — Y93I9 Activity, other involving external motion: Secondary | ICD-10-CM | POA: Insufficient documentation

## 2019-07-17 DIAGNOSIS — S82891A Other fracture of right lower leg, initial encounter for closed fracture: Secondary | ICD-10-CM

## 2019-07-17 DIAGNOSIS — S0121XA Laceration without foreign body of nose, initial encounter: Secondary | ICD-10-CM | POA: Insufficient documentation

## 2019-07-17 DIAGNOSIS — S80212A Abrasion, left knee, initial encounter: Secondary | ICD-10-CM | POA: Insufficient documentation

## 2019-07-17 DIAGNOSIS — Y939 Activity, unspecified: Secondary | ICD-10-CM | POA: Insufficient documentation

## 2019-07-17 DIAGNOSIS — S8991XA Unspecified injury of right lower leg, initial encounter: Secondary | ICD-10-CM | POA: Diagnosis present

## 2019-07-17 DIAGNOSIS — Y999 Unspecified external cause status: Secondary | ICD-10-CM | POA: Insufficient documentation

## 2019-07-17 DIAGNOSIS — Y929 Unspecified place or not applicable: Secondary | ICD-10-CM | POA: Diagnosis not present

## 2019-07-17 DIAGNOSIS — S82301A Unspecified fracture of lower end of right tibia, initial encounter for closed fracture: Secondary | ICD-10-CM

## 2019-07-17 DIAGNOSIS — Z23 Encounter for immunization: Secondary | ICD-10-CM | POA: Insufficient documentation

## 2019-07-17 DIAGNOSIS — F17210 Nicotine dependence, cigarettes, uncomplicated: Secondary | ICD-10-CM | POA: Insufficient documentation

## 2019-07-17 LAB — CBC
HCT: 43.4 % (ref 39.0–52.0)
Hemoglobin: 14.1 g/dL (ref 13.0–17.0)
MCH: 29.1 pg (ref 26.0–34.0)
MCHC: 32.5 g/dL (ref 30.0–36.0)
MCV: 89.5 fL (ref 80.0–100.0)
Platelets: 238 10*3/uL (ref 150–400)
RBC: 4.85 MIL/uL (ref 4.22–5.81)
RDW: 14.2 % (ref 11.5–15.5)
WBC: 10.9 10*3/uL — ABNORMAL HIGH (ref 4.0–10.5)
nRBC: 0 % (ref 0.0–0.2)

## 2019-07-17 LAB — COMPREHENSIVE METABOLIC PANEL
ALT: 20 U/L (ref 0–44)
AST: 57 U/L — ABNORMAL HIGH (ref 15–41)
Albumin: 4 g/dL (ref 3.5–5.0)
Alkaline Phosphatase: 35 U/L — ABNORMAL LOW (ref 38–126)
Anion gap: 10 (ref 5–15)
BUN: 12 mg/dL (ref 6–20)
CO2: 21 mmol/L — ABNORMAL LOW (ref 22–32)
Calcium: 8.5 mg/dL — ABNORMAL LOW (ref 8.9–10.3)
Chloride: 105 mmol/L (ref 98–111)
Creatinine, Ser: 1.3 mg/dL — ABNORMAL HIGH (ref 0.61–1.24)
GFR calc Af Amer: >60 mL/min (ref >60)
GFR calc non Af Amer: >60 mL/min (ref >60)
Glucose, Bld: 101 mg/dL — ABNORMAL HIGH (ref 70–99)
Potassium: 5.4 mmol/L — ABNORMAL HIGH (ref 3.5–5.1)
Sodium: 136 mmol/L (ref 135–145)
Total Bilirubin: 1 mg/dL (ref 0.3–1.2)
Total Protein: 6.2 g/dL — ABNORMAL LOW (ref 6.5–8.1)

## 2019-07-17 LAB — URINALYSIS, ROUTINE W REFLEX MICROSCOPIC
Bilirubin Urine: NEGATIVE
Glucose, UA: NEGATIVE mg/dL
Hgb urine dipstick: NEGATIVE
Ketones, ur: NEGATIVE mg/dL
Leukocytes,Ua: NEGATIVE
Nitrite: NEGATIVE
Protein, ur: NEGATIVE mg/dL
Specific Gravity, Urine: 1.026 (ref 1.005–1.030)
pH: 7 (ref 5.0–8.0)

## 2019-07-17 LAB — I-STAT CHEM 8, ED
BUN: 16 mg/dL (ref 6–20)
Calcium, Ion: 0.98 mmol/L — ABNORMAL LOW (ref 1.15–1.40)
Chloride: 104 mmol/L (ref 98–111)
Creatinine, Ser: 1.5 mg/dL — ABNORMAL HIGH (ref 0.61–1.24)
Glucose, Bld: 93 mg/dL (ref 70–99)
HCT: 45 % (ref 39.0–52.0)
Hemoglobin: 15.3 g/dL (ref 13.0–17.0)
Potassium: 5.4 mmol/L — ABNORMAL HIGH (ref 3.5–5.1)
Sodium: 137 mmol/L (ref 135–145)
TCO2: 25 mmol/L (ref 22–32)

## 2019-07-17 LAB — SAMPLE TO BLOOD BANK

## 2019-07-17 LAB — CDS SEROLOGY

## 2019-07-17 LAB — ETHANOL: Alcohol, Ethyl (B): 218 mg/dL — ABNORMAL HIGH (ref <10)

## 2019-07-17 LAB — PROTIME-INR
INR: 1 (ref 0.8–1.2)
Prothrombin Time: 12.9 seconds (ref 11.4–15.2)

## 2019-07-17 LAB — LACTIC ACID, PLASMA: Lactic Acid, Venous: 2.6 mmol/L (ref 0.5–1.9)

## 2019-07-17 MED ORDER — TETANUS-DIPHTH-ACELL PERTUSSIS 5-2.5-18.5 LF-MCG/0.5 IM SUSP
0.5000 mL | Freq: Once | INTRAMUSCULAR | Status: AC
  Administered 2019-07-17: 0.5 mL via INTRAMUSCULAR
  Filled 2019-07-17: qty 0.5

## 2019-07-17 MED ORDER — ACETAMINOPHEN 500 MG PO TABS
1000.0000 mg | ORAL_TABLET | Freq: Once | ORAL | Status: AC
  Administered 2019-07-17: 1000 mg via ORAL
  Filled 2019-07-17: qty 2

## 2019-07-17 MED ORDER — METHOCARBAMOL 500 MG PO TABS: 500.0000 mg | ORAL_TABLET | Freq: Two times a day (BID) | ORAL | 0 refills | Status: DC

## 2019-07-17 MED ORDER — LIDOCAINE-EPINEPHRINE-TETRACAINE (LET) TOPICAL GEL
3.0000 mL | Freq: Once | TOPICAL | Status: DC
  Filled 2019-07-17: qty 3

## 2019-07-17 MED ORDER — OXYCODONE-ACETAMINOPHEN 5-325 MG PO TABS
1.0000 | ORAL_TABLET | Freq: Once | ORAL | Status: AC
  Administered 2019-07-17: 1 via ORAL
  Filled 2019-07-17: qty 1

## 2019-07-17 MED ORDER — IOHEXOL 300 MG/ML  SOLN
100.0000 mL | Freq: Once | INTRAMUSCULAR | Status: AC | PRN
  Administered 2019-07-17: 06:00:00 100 mL via INTRAVENOUS

## 2019-07-17 MED ORDER — SODIUM CHLORIDE 0.9 % IV BOLUS
1000.0000 mL | Freq: Once | INTRAVENOUS | Status: AC
  Administered 2019-07-17: 06:00:00 1000 mL via INTRAVENOUS

## 2019-07-17 NOTE — ED Notes (Signed)
Ortho tech called about applying short leg/ankle splint.

## 2019-07-17 NOTE — Progress Notes (Signed)
Orthopedic Tech Progress Note Patient Details:  Arthur Ramos 1996/01/22 [id number] Went to apply splint and patient left AMA Patient ID: Arthur Ramos, male   DOB: 1995-11-01, 23 y.o.   MRN: [id number]   Arthur Ramos 07/17/2019, 9:51 AM

## 2019-07-17 NOTE — ED Notes (Signed)
Pt given crutches and stated his ride was here and didn't want to wait on his splint or papers.

## 2019-07-17 NOTE — ED Notes (Signed)
OBSERVED PT OUTSIDE TRIAGE. INQUIRED IF PT WAS GOING TO CONTINUE SEEKING TREATMENT. PT RESPONDED HE WAS GOING TO TAKE A SHORT SMOKE. PT INFORMED OF BED STATUS AND UPDATED ON HIS PLAN OF CARE. ENCOURAGED TO REMAIN CLOSE FOR STAFF TO HAVE VISIBILITY AND CONTINUE HIS PLAN OF CARE. PT VERBALIZED UNDERSTANDING

## 2019-07-17 NOTE — ED Notes (Addendum)
Explained to pt that he had a fracture and should wait on ortho. Stated he would come back another time to get it looked at and had places to be. MD made aware.

## 2019-07-17 NOTE — ED Notes (Signed)
ED Provider at bedside. 
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
## 2019-07-17 NOTE — Discharge Instructions (Signed)
FOLLOW UP WITH ORTHOPEDIST
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
## 2019-07-17 NOTE — ED Notes (Signed)
Pt refused LET, stating it's "too cold".

## 2019-07-18 ENCOUNTER — Encounter (HOSPITAL_COMMUNITY): Payer: Self-pay

## 2019-08-03 ENCOUNTER — Emergency Department (HOSPITAL_COMMUNITY)
Admission: EM | Admit: 2019-08-03 | Discharge: 2019-08-03 | Disposition: A | Discharge status: Home / Self Care | Payer: Self-pay | Attending: Emergency Medicine | Admitting: Emergency Medicine

## 2019-08-03 ENCOUNTER — Other Ambulatory Visit: Payer: Self-pay

## 2019-08-03 DIAGNOSIS — Z79899 Other long term (current) drug therapy: Secondary | ICD-10-CM | POA: Insufficient documentation

## 2019-08-03 DIAGNOSIS — F17210 Nicotine dependence, cigarettes, uncomplicated: Secondary | ICD-10-CM | POA: Insufficient documentation

## 2019-08-03 DIAGNOSIS — Z4789 Encounter for other orthopedic aftercare: Secondary | ICD-10-CM | POA: Insufficient documentation
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
## 2019-08-03 NOTE — Discharge Instructions (Signed)
Keep your follow-up with your orthopedist

## 2019-08-03 NOTE — ED Notes (Signed)
Ortho at bedside.
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
## 2019-09-05 DIAGNOSIS — S8253XA Displaced fracture of medial malleolus of unspecified tibia, initial encounter for closed fracture: Secondary | ICD-10-CM | POA: Insufficient documentation
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
## 2020-11-03 ENCOUNTER — Other Ambulatory Visit: Payer: Self-pay

## 2020-11-03 ENCOUNTER — Encounter (HOSPITAL_COMMUNITY): Payer: Self-pay

## 2020-11-03 ENCOUNTER — Ambulatory Visit (HOSPITAL_COMMUNITY)
Admission: EM | Admit: 2020-11-03 | Discharge: 2020-11-03 | Disposition: A | Discharge status: Home / Self Care | Payer: Self-pay | Attending: Family Medicine | Admitting: Family Medicine

## 2020-11-03 DIAGNOSIS — Z202 Contact with and (suspected) exposure to infections with a predominantly sexual mode of transmission: Secondary | ICD-10-CM | POA: Insufficient documentation

## 2020-11-03 DIAGNOSIS — R369 Urethral discharge, unspecified: Secondary | ICD-10-CM | POA: Insufficient documentation

## 2020-11-03 MED ORDER — CEFTRIAXONE SODIUM 500 MG IJ SOLR
500.0000 mg | Freq: Once | INTRAMUSCULAR | Status: AC
  Administered 2020-11-03: 500 mg via INTRAMUSCULAR
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
## 2020-11-03 NOTE — ED Provider Notes (Signed)
[facility]    CSN: [id number] Arrival date & time: 11/03/20  1407      History   Chief Complaint Chief Complaint  Patient presents with  . Exposure to STD    HPI Arthur Ramos is a 25 y.o. male.   Patient in today with 2-day history of penile discharge, dysuria.  He denies pelvic pain, abdominal pain, nausea vomiting diarrhea, fever, rashes.  He states his partner recently told him that he needed to go get tested for STDs as she may have had one.  He is unsure specifically what she may have had.  Not try anything over-the-counter for symptoms.     Past Medical History:  Diagnosis Date  . Anxiety   . Bipolar 1 disorder (HCC)     There are no problems to display for this patient.   History reviewed. No pertinent surgical history.     Home Medications    Prior to Admission medications   Medication Sig Start Date End Date Taking? Authorizing Provider  acetaminophen (TYLENOL) 500 MG tablet Take 1,000 mg by mouth every 6 (six) hours as needed for moderate pain.    [provider]  methocarbamol (ROBAXIN) 500 MG tablet Take 1 tablet (500 mg total) by mouth 2 (two) times daily. 07/17/19   Gailen Shelter, PA    Family History Family History  Problem Relation Age of Onset  . Healthy Mother   . Healthy Father     Social History Social History   Tobacco Use  . Smoking status: Current Every Day Smoker    Types: Cigarettes, Cigars  . Smokeless tobacco: Never Used  Vaping Use  . Vaping Use: Never used  Substance Use Topics  . Alcohol use: Yes    Alcohol/week: 30.0 standard drinks    Types: 30 Standard drinks or equivalent per week  . Drug use: Yes    Types: Marijuana     Allergies   Patient has no known allergies.   Review of Systems Review of Systems Per HPI Physical Exam Triage Vital Signs ED Triage Vitals  Enc Vitals Group     BP 11/03/20 1418 120/72     Pulse Rate 11/03/20 1418 71     Resp 11/03/20 1418 17     Temp  11/03/20 1418 98.7 F (37.1 C)     Temp src --      SpO2 11/03/20 1418 97 %     Weight --      Height --      Head Circumference --      Peak Flow --      Pain Score 11/03/20 1416 0     Pain Loc --      Pain Edu? --      Excl. in [place]? --    No data found.  Updated Vital Signs BP 120/72 (BP Location: Right Arm)   Pulse 71   Temp 98.7 F (37.1 C)   Resp 17   SpO2 97%   Visual Acuity Right Eye Distance:   Left Eye Distance:   Bilateral Distance:    Right Eye Near:   Left Eye Near:    Bilateral Near:     Physical Exam Vitals and nursing note reviewed.  Constitutional:      Appearance: Normal appearance.  HENT:     Head: Atraumatic.  Eyes:     Extraocular Movements: Extraocular movements intact.     Conjunctiva/sclera: Conjunctivae normal.  Cardiovascular:     Rate  and Rhythm: Normal rate and regular rhythm.  Pulmonary:     Effort: Pulmonary effort is normal.     Breath sounds: Normal breath sounds.  Abdominal:     General: Bowel sounds are normal. There is no distension.     Palpations: Abdomen is soft.     Tenderness: There is no abdominal tenderness. There is no right CVA tenderness, left CVA tenderness or guarding.  Genitourinary:    Comments: GU exam deferred, self swab performed Musculoskeletal:        General: Normal range of motion.     Cervical back: Normal range of motion and neck supple.  Skin:    General: Skin is warm and dry.  Neurological:     General: No focal deficit present.     Mental Status: He is oriented to person, place, and time.  Psychiatric:        Mood and Affect: Mood normal.        Thought Content: Thought content normal.        Judgment: Judgment normal.     UC Treatments / Results  Labs (all labs ordered are listed, but only abnormal results are displayed) Labs Reviewed  CYTOLOGY, (ORAL, ANAL, URETHRAL) ANCILLARY ONLY    EKG   Radiology No results found.  Procedures Procedures (including critical care  time)  Medications Ordered in UC Medications  cefTRIAXone (ROCEPHIN) injection 500 mg (500 mg Intramuscular Given 11/03/20 1443)    Initial Impression / Assessment and Plan / UC Course  I have reviewed the triage vital signs and the nursing notes.  Pertinent labs & imaging results that were available during my care of the patient were reviewed by me and considered in my medical decision making (see chart for details).     Urethral cytology swab pending, will treat with IM Rocephin given likely exposure to STD and penile discharge.  Discussed abstinence from intercourse until results return.  Treat otherwise based on swab results.  Final Clinical Impressions(s) / UC Diagnoses   Final diagnoses:  Penile discharge  Exposure to STD   Discharge Instructions   None    ED Prescriptions    None     PDMP not reviewed this encounter.   Particia Nearing, [place] 11/03/20 1512

## 2020-11-04 LAB — CYTOLOGY, (ORAL, ANAL, URETHRAL) ANCILLARY ONLY
Chlamydia: NEGATIVE
Comment: NEGATIVE
Comment: NEGATIVE
Comment: NORMAL
Neisseria Gonorrhea: POSITIVE — AB
Trichomonas: NEGATIVE

## 2021-09-09 ENCOUNTER — Emergency Department (HOSPITAL_COMMUNITY)
Admission: EM | Admit: 2021-09-09 | Discharge: 2021-09-09 | Disposition: A | Discharge status: Home / Self Care | Payer: Self-pay | Attending: Medical | Admitting: Medical

## 2021-09-09 ENCOUNTER — Emergency Department (HOSPITAL_COMMUNITY): Payer: Self-pay

## 2021-09-09 ENCOUNTER — Other Ambulatory Visit: Payer: Self-pay

## 2021-09-09 ENCOUNTER — Encounter (HOSPITAL_COMMUNITY): Payer: Self-pay

## 2021-09-09 DIAGNOSIS — M79672 Pain in left foot: Secondary | ICD-10-CM | POA: Insufficient documentation

## 2021-09-09 DIAGNOSIS — Z5321 Procedure and treatment not carried out due to patient leaving prior to being seen by health care provider: Secondary | ICD-10-CM | POA: Insufficient documentation

## 2021-09-09 DIAGNOSIS — Y92410 Unspecified street and highway as the place of occurrence of the external cause: Secondary | ICD-10-CM | POA: Insufficient documentation
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
## 2021-09-10 ENCOUNTER — Other Ambulatory Visit: Payer: Self-pay

## 2021-09-10 ENCOUNTER — Ambulatory Visit (HOSPITAL_COMMUNITY)
Admission: EM | Admit: 2021-09-10 | Discharge: 2021-09-10 | Disposition: A | Discharge status: Home / Self Care | Payer: Self-pay | Attending: Family Medicine | Admitting: Family Medicine

## 2021-09-10 ENCOUNTER — Encounter (HOSPITAL_COMMUNITY): Payer: Self-pay

## 2021-09-10 DIAGNOSIS — S92902D Unspecified fracture of left foot, subsequent encounter for fracture with routine healing: Secondary | ICD-10-CM

## 2021-09-10 DIAGNOSIS — S92902A Unspecified fracture of left foot, initial encounter for closed fracture: Secondary | ICD-10-CM

## 2021-09-10 MED ORDER — KETOROLAC TROMETHAMINE 60 MG/2ML IM SOLN
INTRAMUSCULAR | Status: AC
  Filled 2021-09-10: qty 2

## 2021-09-10 MED ORDER — KETOROLAC TROMETHAMINE 60 MG/2ML IM SOLN
60.0000 mg | Freq: Once | INTRAMUSCULAR | Status: AC
  Administered 2021-09-10: 60 mg via INTRAMUSCULAR

## 2021-09-10 MED ORDER — HYDROCODONE-ACETAMINOPHEN 5-325 MG PO TABS: 1.0000 | ORAL_TABLET | ORAL | 0 refills | Status: DC | PRN
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
## 2021-09-10 NOTE — Discharge Instructions (Addendum)
You were seen today for left foot fracture.  You were given a shot of toradol for pain today.  I have given you a short supply of pain medication today.  I recommend you elevate the foot, ice the foot, and you may use motrin/ibuprofen 600mg  every 8 hrs taken with food in addition.   Please follow up with the podiatrist for further care.    You may call [facility].
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
## 2021-09-11 ENCOUNTER — Ambulatory Visit: Payer: 59 | Admitting: Podiatry

## 2021-09-13 ENCOUNTER — Ambulatory Visit: Payer: Self-pay | Admitting: Podiatry

## 2021-09-13 ENCOUNTER — Other Ambulatory Visit: Payer: Self-pay

## 2021-09-13 ENCOUNTER — Encounter: Payer: Self-pay | Admitting: Podiatry

## 2021-09-13 DIAGNOSIS — S92325A Nondisplaced fracture of second metatarsal bone, left foot, initial encounter for closed fracture: Secondary | ICD-10-CM

## 2021-09-13 MED ORDER — HYDROCODONE-ACETAMINOPHEN 5-325 MG PO TABS: 1.0000 | ORAL_TABLET | Freq: Four times a day (QID) | ORAL | 0 refills | Status: AC | PRN

## 2021-09-13 MED ORDER — IBUPROFEN 800 MG PO TABS: 800.0000 mg | ORAL_TABLET | Freq: Three times a day (TID) | ORAL | 1 refills | Status: DC | PRN

## 2021-09-13 MED ORDER — HYDROCODONE-ACETAMINOPHEN 5-325 MG PO TABS: 1.0000 | ORAL_TABLET | Freq: Four times a day (QID) | ORAL | 0 refills | Status: DC | PRN
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
## 2021-10-25 ENCOUNTER — Ambulatory Visit: Payer: Self-pay | Admitting: Podiatry

## 2021-10-30 ENCOUNTER — Telehealth: Payer: Self-pay | Admitting: *Deleted
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
## 2022-05-01 ENCOUNTER — Encounter (HOSPITAL_COMMUNITY): Payer: Self-pay

## 2022-05-01 ENCOUNTER — Emergency Department (HOSPITAL_COMMUNITY)
Admission: EM | Admit: 2022-05-01 | Discharge: 2022-05-01 | Disposition: A | Discharge status: Home / Self Care | Payer: Self-pay | Attending: Emergency Medicine | Admitting: Emergency Medicine

## 2022-05-01 ENCOUNTER — Other Ambulatory Visit: Payer: Self-pay

## 2022-05-01 DIAGNOSIS — Z23 Encounter for immunization: Secondary | ICD-10-CM | POA: Insufficient documentation

## 2022-05-01 DIAGNOSIS — S61452A Open bite of left hand, initial encounter: Secondary | ICD-10-CM | POA: Insufficient documentation

## 2022-05-01 DIAGNOSIS — R000 Tachycardia, unspecified: Secondary | ICD-10-CM | POA: Insufficient documentation

## 2022-05-01 DIAGNOSIS — S61412A Laceration without foreign body of left hand, initial encounter: Secondary | ICD-10-CM

## 2022-05-01 DIAGNOSIS — W503XXA Accidental bite by another person, initial encounter: Secondary | ICD-10-CM

## 2022-05-01 MED ORDER — AMOXICILLIN-POT CLAVULANATE 875-125 MG PO TABS
1.0000 | ORAL_TABLET | Freq: Once | ORAL | Status: AC
  Administered 2022-05-01: 1 via ORAL
  Filled 2022-05-01: qty 1

## 2022-05-01 MED ORDER — TETANUS-DIPHTH-ACELL PERTUSSIS 5-2.5-18.5 LF-MCG/0.5 IM SUSY
0.5000 mL | PREFILLED_SYRINGE | Freq: Once | INTRAMUSCULAR | Status: AC
  Administered 2022-05-01: 0.5 mL via INTRAMUSCULAR
  Filled 2022-05-01: qty 0.5

## 2022-05-01 MED ORDER — AMOXICILLIN-POT CLAVULANATE 875-125 MG PO TABS: 1.0000 | ORAL_TABLET | Freq: Two times a day (BID) | ORAL | 0 refills | Status: DC

## 2022-05-01 NOTE — Discharge Instructions (Addendum)
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
## 2022-05-01 NOTE — ED Notes (Signed)
Patient's laceration on hand dressed with a non-adherent, clean and dry dressing. Patient given discharge teaching, given the opportunity to ask questions, and states understanding of instructions. Patient ambulatory at this time with a steady gate.

## 2022-05-01 NOTE — ED Notes (Signed)
Patient arrives carrying child. Pt reports child is his. Per ems etoh on board.

## 2023-01-01 ENCOUNTER — Emergency Department (HOSPITAL_COMMUNITY): Payer: Self-pay | Admitting: Certified Registered Nurse Anesthetist

## 2023-01-01 ENCOUNTER — Other Ambulatory Visit: Payer: Self-pay

## 2023-01-01 ENCOUNTER — Emergency Department (HOSPITAL_COMMUNITY): Payer: Self-pay

## 2023-01-01 ENCOUNTER — Encounter (HOSPITAL_COMMUNITY)
Admission: EM | Disposition: A | Discharge status: Home / Self Care | Payer: Self-pay | Source: Home / Self Care | Attending: Student

## 2023-01-01 ENCOUNTER — Inpatient Hospital Stay (HOSPITAL_COMMUNITY)
Admission: EM | Admit: 2023-01-01 | Discharge: 2023-01-10 | DRG: 908 | Disposition: A | Discharge status: Home / Self Care | Payer: Self-pay | Attending: Student | Admitting: Student

## 2023-01-01 DIAGNOSIS — F419 Anxiety disorder, unspecified: Secondary | ICD-10-CM | POA: Diagnosis present

## 2023-01-01 DIAGNOSIS — D62 Acute posthemorrhagic anemia: Secondary | ICD-10-CM | POA: Diagnosis not present

## 2023-01-01 DIAGNOSIS — E876 Hypokalemia: Secondary | ICD-10-CM | POA: Diagnosis present

## 2023-01-01 DIAGNOSIS — D649 Anemia, unspecified: Secondary | ICD-10-CM

## 2023-01-01 DIAGNOSIS — S83262A Peripheral tear of lateral meniscus, current injury, left knee, initial encounter: Secondary | ICD-10-CM | POA: Diagnosis present

## 2023-01-01 DIAGNOSIS — F319 Bipolar disorder, unspecified: Secondary | ICD-10-CM | POA: Diagnosis present

## 2023-01-01 DIAGNOSIS — T79A22A Traumatic compartment syndrome of left lower extremity, initial encounter: Secondary | ICD-10-CM | POA: Diagnosis present

## 2023-01-01 DIAGNOSIS — S82832A Other fracture of upper and lower end of left fibula, initial encounter for closed fracture: Secondary | ICD-10-CM | POA: Diagnosis present

## 2023-01-01 DIAGNOSIS — F17210 Nicotine dependence, cigarettes, uncomplicated: Secondary | ICD-10-CM | POA: Diagnosis present

## 2023-01-01 DIAGNOSIS — S82142A Displaced bicondylar fracture of left tibia, initial encounter for closed fracture: Secondary | ICD-10-CM | POA: Diagnosis present

## 2023-01-01 DIAGNOSIS — T79A29A Traumatic compartment syndrome of unspecified lower extremity, initial encounter: Secondary | ICD-10-CM

## 2023-01-01 HISTORY — PX: APPLICATION OF WOUND VAC: SHX5189

## 2023-01-01 HISTORY — PX: FASCIOTOMY: SHX132

## 2023-01-01 LAB — CBC WITH DIFFERENTIAL/PLATELET
Abs Immature Granulocytes: 0.05 10*3/uL (ref 0.00–0.07)
Basophils Absolute: 0 10*3/uL (ref 0.0–0.1)
Basophils Relative: 0 %
Eosinophils Absolute: 0.1 10*3/uL (ref 0.0–0.5)
Eosinophils Relative: 1 %
HCT: 37.5 % — ABNORMAL LOW (ref 39.0–52.0)
Hemoglobin: 12.3 g/dL — ABNORMAL LOW (ref 13.0–17.0)
Immature Granulocytes: 0 %
Lymphocytes Relative: 18 %
Lymphs Abs: 2.1 10*3/uL (ref 0.7–4.0)
MCH: 28.2 pg (ref 26.0–34.0)
MCHC: 32.8 g/dL (ref 30.0–36.0)
MCV: 86 fL (ref 80.0–100.0)
Monocytes Absolute: 0.7 10*3/uL (ref 0.1–1.0)
Monocytes Relative: 6 %
Neutro Abs: 9 10*3/uL — ABNORMAL HIGH (ref 1.7–7.7)
Neutrophils Relative %: 75 %
Platelets: 320 10*3/uL (ref 150–400)
RBC: 4.36 MIL/uL (ref 4.22–5.81)
RDW: 14.3 % (ref 11.5–15.5)
WBC: 12 10*3/uL — ABNORMAL HIGH (ref 4.0–10.5)
nRBC: 0 % (ref 0.0–0.2)

## 2023-01-01 LAB — BASIC METABOLIC PANEL
Anion gap: 9 (ref 5–15)
BUN: 8 mg/dL (ref 6–20)
CO2: 22 mmol/L (ref 22–32)
Calcium: 8.1 mg/dL — ABNORMAL LOW (ref 8.9–10.3)
Chloride: 104 mmol/L (ref 98–111)
Creatinine, Ser: 0.89 mg/dL (ref 0.61–1.24)
GFR, Estimated: >60 mL/min (ref >60)
Glucose, Bld: 99 mg/dL (ref 70–99)
Potassium: 3.2 mmol/L — ABNORMAL LOW (ref 3.5–5.1)
Sodium: 135 mmol/L (ref 135–145)

## 2023-01-01 SURGERY — FASCIOTOMY, UPPER EXTREMITY
Anesthesia: General | Site: Leg Lower | Laterality: Left

## 2023-01-01 MED ORDER — DEXAMETHASONE SODIUM PHOSPHATE 10 MG/ML IJ SOLN
INTRAMUSCULAR | Status: AC
  Filled 2023-01-01: qty 1

## 2023-01-01 MED ORDER — ONDANSETRON HCL 4 MG/2ML IJ SOLN
INTRAMUSCULAR | Status: AC
  Filled 2023-01-01: qty 2

## 2023-01-01 MED ORDER — FENTANYL CITRATE (PF) 100 MCG/2ML IJ SOLN
INTRAMUSCULAR | Status: DC | PRN
  Administered 2023-01-01: 100 ug via INTRAVENOUS
  Administered 2023-01-02: 50 ug via INTRAVENOUS

## 2023-01-01 MED ORDER — MORPHINE SULFATE (PF) 4 MG/ML IV SOLN
4.0000 mg | Freq: Once | INTRAVENOUS | Status: AC
  Administered 2023-01-01: 4 mg via INTRAVENOUS
  Filled 2023-01-01: qty 1

## 2023-01-01 MED ORDER — HYDROMORPHONE HCL 1 MG/ML IJ SOLN
1.0000 mg | Freq: Once | INTRAMUSCULAR | Status: AC
  Administered 2023-01-01: 1 mg via INTRAVENOUS
  Filled 2023-01-01: qty 1

## 2023-01-01 MED ORDER — KETAMINE HCL 10 MG/ML IJ SOLN
INTRAMUSCULAR | Status: DC | PRN
  Administered 2023-01-01: 20 mg via INTRAVENOUS
  Administered 2023-01-01: 30 mg via INTRAVENOUS

## 2023-01-01 MED ORDER — DEXAMETHASONE SODIUM PHOSPHATE 10 MG/ML IJ SOLN
INTRAMUSCULAR | Status: DC | PRN
  Administered 2023-01-01: 10 mg via INTRAVENOUS

## 2023-01-01 MED ORDER — ROCURONIUM BROMIDE 10 MG/ML (PF) SYRINGE
PREFILLED_SYRINGE | INTRAVENOUS | Status: DC | PRN
  Administered 2023-01-01: 30 mg via INTRAVENOUS

## 2023-01-01 MED ORDER — PROPOFOL 10 MG/ML IV BOLUS
INTRAVENOUS | Status: DC | PRN
  Administered 2023-01-01: 200 mg via INTRAVENOUS

## 2023-01-01 MED ORDER — PROPOFOL 10 MG/ML IV BOLUS
INTRAVENOUS | Status: AC
  Filled 2023-01-01: qty 20

## 2023-01-01 MED ORDER — KETAMINE HCL 50 MG/5ML IJ SOSY
PREFILLED_SYRINGE | INTRAMUSCULAR | Status: AC
  Filled 2023-01-01: qty 5

## 2023-01-01 MED ORDER — LACTATED RINGERS IV SOLN: INTRAVENOUS | Status: DC | PRN

## 2023-01-01 MED ORDER — CEFAZOLIN SODIUM 1 G IJ SOLR
INTRAMUSCULAR | Status: AC
  Filled 2023-01-01: qty 20

## 2023-01-01 MED ORDER — CEFAZOLIN SODIUM-DEXTROSE 2-3 GM-%(50ML) IV SOLR
INTRAVENOUS | Status: DC | PRN
  Administered 2023-01-01: 2 g via INTRAVENOUS

## 2023-01-01 MED ORDER — FENTANYL CITRATE (PF) 100 MCG/2ML IJ SOLN
INTRAMUSCULAR | Status: AC
  Filled 2023-01-01: qty 2

## 2023-01-01 MED ORDER — LIDOCAINE 2% (20 MG/ML) 5 ML SYRINGE
INTRAMUSCULAR | Status: DC | PRN
  Administered 2023-01-01: 100 mg via INTRAVENOUS

## 2023-01-01 MED ORDER — MIDAZOLAM HCL 5 MG/5ML IJ SOLN
INTRAMUSCULAR | Status: DC | PRN
  Administered 2023-01-01: 2 mg via INTRAVENOUS

## 2023-01-01 MED ORDER — DEXMEDETOMIDINE HCL IN NACL 80 MCG/20ML IV SOLN
INTRAVENOUS | Status: AC
  Filled 2023-01-01: qty 20

## 2023-01-01 MED ORDER — DIAZEPAM 5 MG/ML IJ SOLN
2.5000 mg | Freq: Once | INTRAMUSCULAR | Status: AC
  Administered 2023-01-01: 2.5 mg via INTRAVENOUS
  Filled 2023-01-01: qty 2

## 2023-01-01 MED ORDER — SUCCINYLCHOLINE CHLORIDE 200 MG/10ML IV SOSY
PREFILLED_SYRINGE | INTRAVENOUS | Status: DC | PRN
  Administered 2023-01-01: 180 mg via INTRAVENOUS

## 2023-01-01 MED ORDER — MIDAZOLAM HCL 2 MG/2ML IJ SOLN
INTRAMUSCULAR | Status: AC
  Filled 2023-01-01: qty 2

## 2023-01-01 MED ORDER — ONDANSETRON HCL 4 MG/2ML IJ SOLN
4.0000 mg | Freq: Once | INTRAMUSCULAR | Status: AC
  Administered 2023-01-01: 4 mg via INTRAVENOUS
  Filled 2023-01-01: qty 2

## 2023-01-01 SURGICAL SUPPLY — 64 items
ADH SKN CLS APL DERMABOND .7 (GAUZE/BANDAGES/DRESSINGS) ×2
APL PRP STRL LF DISP 70% ISPRP (MISCELLANEOUS) ×4
BAG COUNTER SPONGE SURGICOUNT (BAG) ×2 IMPLANT
BAG SPNG CNTER NS LX DISP (BAG) ×2
BANDAGE ESMARK 6X9 LF (GAUZE/BANDAGES/DRESSINGS) IMPLANT
BAR EXFX 500X11 NS LF (EXFIX) ×4
BAR GLASS FIBER EXFX 11X500 (EXFIX) ×2 IMPLANT
BNDG CMPR 5X4 CHSV STRCH STRL (GAUZE/BANDAGES/DRESSINGS) ×2
BNDG CMPR 5X4 KNIT ELC UNQ LF (GAUZE/BANDAGES/DRESSINGS)
BNDG CMPR 9X6 STRL LF SNTH (GAUZE/BANDAGES/DRESSINGS)
BNDG CMPR MED 10X6 ELC LF (GAUZE/BANDAGES/DRESSINGS) ×2
BNDG COHESIVE 4X5 TAN STRL LF (GAUZE/BANDAGES/DRESSINGS) ×2 IMPLANT
BNDG ELASTIC 4INX 5YD STR LF (GAUZE/BANDAGES/DRESSINGS) IMPLANT
BNDG ELASTIC 6X10 VLCR STRL LF (GAUZE/BANDAGES/DRESSINGS) ×1 IMPLANT
BNDG ESMARK 6X9 LF (GAUZE/BANDAGES/DRESSINGS)
BNDG GAUZE DERMACEA FLUFF 4 (GAUZE/BANDAGES/DRESSINGS) ×2 IMPLANT
BNDG GZE DERMACEA 4 6PLY (GAUZE/BANDAGES/DRESSINGS) ×4
CANISTER WOUNDNEG PRESSURE 500 (CANNISTER) ×2 IMPLANT
CHLORAPREP W/TINT 26 (MISCELLANEOUS) ×4 IMPLANT
COVER MAYO STAND STRL (DRAPES) ×1 IMPLANT
COVER SURGICAL LIGHT HANDLE (MISCELLANEOUS) ×2 IMPLANT
CUFF TOURN SGL QUICK 34 (TOURNIQUET CUFF) ×2
CUFF TRNQT CYL 34X4.125X (TOURNIQUET CUFF) ×2 IMPLANT
DERMABOND ADVANCED .7 DNX12 (GAUZE/BANDAGES/DRESSINGS) ×2 IMPLANT
DRAPE 3/4 80X56 (DRAPES) ×2 IMPLANT
DRAPE C-ARM 42X120 X-RAY (DRAPES) ×2 IMPLANT
DRAPE C-ARMOR (DRAPES) ×2 IMPLANT
DRAPE U-SHAPE 47X51 STRL (DRAPES) ×2 IMPLANT
DRSG VAC GRANUFOAM LG (GAUZE/BANDAGES/DRESSINGS) ×1 IMPLANT
DRSG VAC GRANUFOAM SM (GAUZE/BANDAGES/DRESSINGS) ×1 IMPLANT
ELECT REM PT RETURN 15FT ADLT (MISCELLANEOUS) ×2 IMPLANT
GAUZE PAD ABD 8X10 STRL (GAUZE/BANDAGES/DRESSINGS) IMPLANT
GAUZE SPONGE 4X4 12PLY STRL (GAUZE/BANDAGES/DRESSINGS) IMPLANT
GAUZE XEROFORM 1X8 LF (GAUZE/BANDAGES/DRESSINGS) ×2 IMPLANT
GLOVE BIO SURGEON STRL SZ8 (GLOVE) ×5 IMPLANT
GLOVE BIOGEL PI IND STRL 7.5 (GLOVE) ×2 IMPLANT
GLOVE BIOGEL PI IND STRL 8 (GLOVE) ×5 IMPLANT
GLOVE BIOGEL PI IND STRL 9 (GLOVE) ×2 IMPLANT
GOWN STRL REUS W/ TWL LRG LVL3 (GOWN DISPOSABLE) IMPLANT
GOWN STRL REUS W/ TWL XL LVL3 (GOWN DISPOSABLE) ×2 IMPLANT
GOWN STRL REUS W/TWL LRG LVL3 (GOWN DISPOSABLE)
GOWN STRL REUS W/TWL XL LVL3 (GOWN DISPOSABLE) ×4 IMPLANT
KIT BASIN OR (CUSTOM PROCEDURE TRAY) ×2 IMPLANT
KIT TURNOVER KIT A (KITS) ×1 IMPLANT
MANIFOLD NEPTUNE II (INSTRUMENTS) ×1 IMPLANT
PACK ORTHO EXTREMITY (CUSTOM PROCEDURE TRAY) ×2 IMPLANT
PAD ARMBOARD 7.5X6 YLW CONV (MISCELLANEOUS) ×4 IMPLANT
PAD CAST 4YDX4 CTTN HI CHSV (CAST SUPPLIES) IMPLANT
PADDING CAST COTTON 4X4 STRL (CAST SUPPLIES)
PIN CLAMP 2BAR 75MM BLUE (EXFIX) ×2 IMPLANT
PIN HALF ORANGE 5X200X45MM (EXFIX) ×4 IMPLANT
STRIP CLOSURE SKIN 1/2X4 (GAUZE/BANDAGES/DRESSINGS) IMPLANT
SUCTION FRAZIER HANDLE 10FR (MISCELLANEOUS) ×2
SUCTION TUBE FRAZIER 10FR DISP (MISCELLANEOUS) ×2 IMPLANT
SUT ETHILON 3 0 PS 1 (SUTURE) ×8 IMPLANT
SUT VIC AB 0 CT1 36 (SUTURE) ×2 IMPLANT
SUT VIC AB 2-0 CT1 27 (SUTURE) ×4
SUT VIC AB 2-0 CT1 TAPERPNT 27 (SUTURE) ×4 IMPLANT
SYR BULB IRRIG 60ML STRL (SYRINGE) ×2 IMPLANT
SYR CONTROL 10ML LL (SYRINGE) IMPLANT
TOWEL OR 17X26 10 PK STRL BLUE (TOWEL DISPOSABLE) ×2 IMPLANT
UNDERPAD 30X36 HEAVY ABSORB (UNDERPADS AND DIAPERS) ×2 IMPLANT
WATER STERILE IRR 1000ML POUR (IV SOLUTION) ×2 IMPLANT
YANKAUER SUCT BULB TIP NO VENT (SUCTIONS) IMPLANT

## 2023-01-01 NOTE — ED Notes (Signed)
Ankle monitor facility here to remove ankle monitor

## 2023-01-01 NOTE — ED Notes (Signed)
Pt requesting more pain medications. Dr. Particia Nearing notified

## 2023-01-01 NOTE — ED Notes (Signed)
Pt to OR.

## 2023-01-01 NOTE — Anesthesia Preprocedure Evaluation (Signed)
Anesthesia Evaluation  Patient identified by MRN, date of birth, ID band Patient awake    Reviewed: Allergy & Precautions, NPO status , Patient's Chart, lab work & pertinent test results  Airway Mallampati: I       Dental no notable dental hx. (+) Caps   Pulmonary Current SmokerPatient did not abstain from smoking.   Pulmonary exam normal        Cardiovascular negative cardio ROS Normal cardiovascular exam     Neuro/Psych  PSYCHIATRIC DISORDERS Anxiety  Bipolar Disorder   negative neurological ROS     GI/Hepatic negative GI ROS, Neg liver ROS,,,  Endo/Other  negative endocrine ROS    Renal/GU negative Renal ROS  negative genitourinary   Musculoskeletal   Abdominal Normal abdominal exam  (+)   Peds  Hematology  (+) Blood dyscrasia, anemia   Anesthesia Other Findings   Reproductive/Obstetrics                             Anesthesia Physical Anesthesia Plan  ASA: 2 and emergent  Anesthesia Plan: General   Post-op Pain Management: Dilaudid IV and Ketamine IV*   Induction: Intravenous, Cricoid pressure planned and Rapid sequence  PONV Risk Score and Plan: Ondansetron, Dexamethasone, Midazolam and Treatment may vary due to age or medical condition  Airway Management Planned: Oral ETT  Additional Equipment: None  Intra-op Plan:   Post-operative Plan: Extubation in OR  Informed Consent: I have reviewed the patients History and Physical, chart, labs and discussed the procedure including the risks, benefits and alternatives for the proposed anesthesia with the patient or authorized representative who has indicated his/her understanding and acceptance.     Dental advisory given  Plan Discussed with: CRNA  Anesthesia Plan Comments:        Anesthesia Quick Evaluation
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
## 2023-01-01 NOTE — ED Notes (Signed)
Arthur Ramos called back and stated he would remove ankle monitor. He will be here in 15 minutes

## 2023-01-01 NOTE — ED Notes (Addendum)
Pt has ankle monitor to left ankle, ankle monitor agency told this RN to cut ankle monitor off and give to security for him to get tomorrow. Spoke with Aquilina at company.  Ankle monitor company [phone]
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
## 2023-01-01 NOTE — Anesthesia Procedure Notes (Signed)
Procedure Name: Intubation Date/Time: 01/01/2023 11:43 PM  Performed by: Basilio Cairo, CRNAPre-anesthesia Checklist: Patient identified, Patient being monitored, Timeout performed, Emergency Drugs available and Suction available Patient Re-evaluated:Patient Re-evaluated prior to induction Oxygen Delivery Method: Circle system utilized Preoxygenation: Pre-oxygenation with 100% oxygen Induction Type: IV induction and Rapid sequence Ventilation: Mask ventilation without difficulty Laryngoscope Size: Mac and 3 Grade View: Grade I Tube type: Oral Tube size: 7.5 mm Number of attempts: 1 Airway Equipment and Method: Stylet Placement Confirmation: ETT inserted through vocal cords under direct vision, positive ETCO2 and breath sounds checked- equal and bilateral Secured at: 21 cm Tube secured with: Tape Dental Injury: Teeth and Oropharynx as per pre-operative assessment
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
## 2023-01-02 ENCOUNTER — Inpatient Hospital Stay (HOSPITAL_COMMUNITY): Payer: Self-pay

## 2023-01-02 ENCOUNTER — Encounter (HOSPITAL_COMMUNITY): Payer: Self-pay | Admitting: Orthopedic Surgery

## 2023-01-02 ENCOUNTER — Encounter (HOSPITAL_COMMUNITY): Payer: Self-pay

## 2023-01-02 LAB — BASIC METABOLIC PANEL
Anion gap: 11 (ref 5–15)
BUN: 7 mg/dL (ref 6–20)
CO2: 22 mmol/L (ref 22–32)
Calcium: 8.6 mg/dL — ABNORMAL LOW (ref 8.9–10.3)
Chloride: 99 mmol/L (ref 98–111)
Creatinine, Ser: 0.85 mg/dL (ref 0.61–1.24)
GFR, Estimated: >60 mL/min (ref >60)
Glucose, Bld: 146 mg/dL — ABNORMAL HIGH (ref 70–99)
Potassium: 3.8 mmol/L (ref 3.5–5.1)
Sodium: 132 mmol/L — ABNORMAL LOW (ref 135–145)

## 2023-01-02 LAB — CBC
HCT: 37.9 % — ABNORMAL LOW (ref 39.0–52.0)
Hemoglobin: 12.4 g/dL — ABNORMAL LOW (ref 13.0–17.0)
MCH: 28.2 pg (ref 26.0–34.0)
MCHC: 32.7 g/dL (ref 30.0–36.0)
MCV: 86.1 fL (ref 80.0–100.0)
Platelets: 355 10*3/uL (ref 150–400)
RBC: 4.4 MIL/uL (ref 4.22–5.81)
RDW: 14.4 % (ref 11.5–15.5)
WBC: 16.3 10*3/uL — ABNORMAL HIGH (ref 4.0–10.5)
nRBC: 0 % (ref 0.0–0.2)

## 2023-01-02 LAB — CK: Total CK: 689 U/L — ABNORMAL HIGH (ref 49–397)

## 2023-01-02 MED ORDER — ONDANSETRON HCL 4 MG/2ML IJ SOLN
INTRAMUSCULAR | Status: DC | PRN
  Administered 2023-01-02: 4 mg via INTRAVENOUS

## 2023-01-02 MED ORDER — HYDROMORPHONE HCL 1 MG/ML IJ SOLN
INTRAMUSCULAR | Status: AC
  Filled 2023-01-02: qty 1

## 2023-01-02 MED ORDER — ZOLPIDEM TARTRATE 5 MG PO TABS
5.0000 mg | ORAL_TABLET | Freq: Every evening | ORAL | Status: DC | PRN
  Administered 2023-01-02 – 2023-01-04 (×4): 5 mg via ORAL
  Filled 2023-01-02 (×4): qty 1

## 2023-01-02 MED ORDER — POVIDONE-IODINE 10 % EX SWAB: 2.0000 | Freq: Once | CUTANEOUS | Status: DC

## 2023-01-02 MED ORDER — ACETAMINOPHEN 500 MG PO TABS
1000.0000 mg | ORAL_TABLET | Freq: Four times a day (QID) | ORAL | Status: AC
  Administered 2023-01-02 (×2): 1000 mg via ORAL
  Filled 2023-01-02 (×2): qty 2

## 2023-01-02 MED ORDER — DIPHENHYDRAMINE HCL 12.5 MG/5ML PO ELIX: 12.5000 mg | ORAL_SOLUTION | ORAL | Status: DC | PRN

## 2023-01-02 MED ORDER — OXYCODONE HCL 5 MG PO TABS
ORAL_TABLET | ORAL | Status: AC
  Filled 2023-01-02: qty 1

## 2023-01-02 MED ORDER — SUGAMMADEX SODIUM 200 MG/2ML IV SOLN
INTRAVENOUS | Status: DC | PRN
  Administered 2023-01-02: 200 mg via INTRAVENOUS

## 2023-01-02 MED ORDER — CEFAZOLIN SODIUM-DEXTROSE 2-4 GM/100ML-% IV SOLN
2.0000 g | Freq: Three times a day (TID) | INTRAVENOUS | Status: AC
  Administered 2023-01-02 – 2023-01-04 (×8): 2 g via INTRAVENOUS
  Filled 2023-01-02 (×9): qty 100

## 2023-01-02 MED ORDER — PANTOPRAZOLE SODIUM 40 MG PO TBEC
40.0000 mg | DELAYED_RELEASE_TABLET | Freq: Every day | ORAL | Status: DC
  Administered 2023-01-03 – 2023-01-09 (×5): 40 mg via ORAL
  Filled 2023-01-02 (×8): qty 1

## 2023-01-02 MED ORDER — MEPERIDINE HCL 50 MG/ML IJ SOLN
INTRAMUSCULAR | Status: AC
  Filled 2023-01-02: qty 1

## 2023-01-02 MED ORDER — METHOCARBAMOL 500 MG IVPB - SIMPLE MED
INTRAVENOUS | Status: AC
  Filled 2023-01-02: qty 55

## 2023-01-02 MED ORDER — CEFAZOLIN SODIUM-DEXTROSE 2-4 GM/100ML-% IV SOLN: 2.0000 g | INTRAVENOUS | Status: DC

## 2023-01-02 MED ORDER — MENTHOL 3 MG MT LOZG: 1.0000 | LOZENGE | OROMUCOSAL | Status: DC | PRN

## 2023-01-02 MED ORDER — PHENOL 1.4 % MT LIQD: 1.0000 | OROMUCOSAL | Status: DC | PRN

## 2023-01-02 MED ORDER — HYDROMORPHONE HCL 1 MG/ML IJ SOLN
0.5000 mg | INTRAMUSCULAR | Status: DC | PRN
  Administered 2023-01-02 – 2023-01-07 (×21): 1 mg via INTRAVENOUS
  Filled 2023-01-02 (×22): qty 1

## 2023-01-02 MED ORDER — OXYCODONE HCL 5 MG PO TABS
5.0000 mg | ORAL_TABLET | ORAL | Status: DC | PRN
  Administered 2023-01-02 (×3): 5 mg via ORAL
  Administered 2023-01-03 (×2): 10 mg via ORAL
  Administered 2023-01-03: 5 mg via ORAL
  Administered 2023-01-03 – 2023-01-06 (×7): 10 mg via ORAL
  Filled 2023-01-02: qty 1
  Filled 2023-01-02: qty 2
  Filled 2023-01-02 (×2): qty 1
  Filled 2023-01-02 (×8): qty 2

## 2023-01-02 MED ORDER — HYDROMORPHONE HCL 1 MG/ML IJ SOLN
0.2500 mg | INTRAMUSCULAR | Status: DC | PRN
  Administered 2023-01-02 (×3): 0.5 mg via INTRAVENOUS

## 2023-01-02 MED ORDER — ONDANSETRON HCL 4 MG/2ML IJ SOLN: 4.0000 mg | Freq: Four times a day (QID) | INTRAMUSCULAR | Status: DC | PRN

## 2023-01-02 MED ORDER — CEFAZOLIN SODIUM-DEXTROSE 2-4 GM/100ML-% IV SOLN: 2.0000 g | Freq: Four times a day (QID) | INTRAVENOUS | Status: DC

## 2023-01-02 MED ORDER — 0.9 % SODIUM CHLORIDE (POUR BTL) OPTIME
TOPICAL | Status: DC | PRN
  Administered 2023-01-02: 2000 mL

## 2023-01-02 MED ORDER — ACETAMINOPHEN 10 MG/ML IV SOLN
1000.0000 mg | Freq: Once | INTRAVENOUS | Status: DC | PRN
  Administered 2023-01-02: 1000 mg via INTRAVENOUS

## 2023-01-02 MED ORDER — ACETAMINOPHEN 10 MG/ML IV SOLN
INTRAVENOUS | Status: AC
  Filled 2023-01-02: qty 100

## 2023-01-02 MED ORDER — MEPERIDINE HCL 50 MG/ML IJ SOLN
6.2500 mg | INTRAMUSCULAR | Status: DC | PRN
  Administered 2023-01-02: 12.5 mg via INTRAVENOUS

## 2023-01-02 MED ORDER — ACETAMINOPHEN 325 MG PO TABS
325.0000 mg | ORAL_TABLET | Freq: Four times a day (QID) | ORAL | Status: DC | PRN
  Administered 2023-01-02 – 2023-01-03 (×2): 325 mg via ORAL
  Filled 2023-01-02 (×2): qty 1

## 2023-01-02 MED ORDER — HYDROMORPHONE HCL 1 MG/ML IJ SOLN
INTRAMUSCULAR | Status: AC
  Administered 2023-01-02: 0.5 mg via INTRAVENOUS
  Filled 2023-01-02: qty 1

## 2023-01-02 MED ORDER — DOCUSATE SODIUM 100 MG PO CAPS
100.0000 mg | ORAL_CAPSULE | Freq: Two times a day (BID) | ORAL | Status: DC
  Administered 2023-01-03 – 2023-01-06 (×4): 100 mg via ORAL
  Filled 2023-01-02 (×9): qty 1

## 2023-01-02 MED ORDER — ONDANSETRON HCL 4 MG PO TABS: 4.0000 mg | ORAL_TABLET | Freq: Four times a day (QID) | ORAL | Status: DC | PRN

## 2023-01-02 MED ORDER — MELATONIN 3 MG PO TABS
3.0000 mg | ORAL_TABLET | Freq: Every day | ORAL | Status: DC
  Administered 2023-01-04 – 2023-01-09 (×6): 3 mg via ORAL
  Filled 2023-01-02 (×7): qty 1

## 2023-01-02 MED ORDER — POLYETHYLENE GLYCOL 3350 17 G PO PACK: 17.0000 g | PACK | Freq: Every day | ORAL | Status: DC | PRN

## 2023-01-02 MED ORDER — FENTANYL CITRATE (PF) 100 MCG/2ML IJ SOLN
INTRAMUSCULAR | Status: AC
  Filled 2023-01-02: qty 2

## 2023-01-02 MED ORDER — PROMETHAZINE HCL 25 MG/ML IJ SOLN: 6.2500 mg | INTRAMUSCULAR | Status: DC | PRN

## 2023-01-02 MED ORDER — LACTATED RINGERS IV SOLN: INTRAVENOUS | Status: DC

## 2023-01-02 MED ORDER — ENOXAPARIN SODIUM 30 MG/0.3ML IJ SOSY
30.0000 mg | PREFILLED_SYRINGE | Freq: Two times a day (BID) | INTRAMUSCULAR | Status: DC
  Administered 2023-01-03 – 2023-01-08 (×10): 30 mg via SUBCUTANEOUS
  Filled 2023-01-02 (×10): qty 0.3

## 2023-01-02 MED ORDER — METHOCARBAMOL 500 MG PO TABS
500.0000 mg | ORAL_TABLET | Freq: Four times a day (QID) | ORAL | Status: DC | PRN
  Administered 2023-01-02 – 2023-01-07 (×6): 500 mg via ORAL
  Filled 2023-01-02 (×6): qty 1

## 2023-01-02 MED ORDER — METHOCARBAMOL 500 MG IVPB - SIMPLE MED
500.0000 mg | Freq: Four times a day (QID) | INTRAVENOUS | Status: DC | PRN
  Administered 2023-01-02: 500 mg via INTRAVENOUS

## 2023-01-02 NOTE — Transfer of Care (Signed)
Immediate Anesthesia Transfer of Care Note  Patient: Arthur Ramos  Procedure(s) Performed: Procedure(s): LEG FASCIOTOMY, EXTERNAL FIXATION APPLICATION CLOSED REDUCTION (Left) APPLICATION OF WOUND VAC X2 (Left)  Patient Location: PACU  Anesthesia Type:General  Level of Consciousness: Alert, Awake, Oriented  Airway & Oxygen Therapy: Patient Spontanous Breathing  Post-op Assessment: Report given to RN  Post vital signs: Reviewed and stable  Last Vitals:  Vitals:   01/01/23 2315 01/01/23 2330  BP: (!) 157/101 (!) 158/106  Pulse: 73 (!) 57  Resp: 14 12  Temp:    SpO2: 100% 98%    Complications: No apparent anesthesia complications

## 2023-01-02 NOTE — Progress Notes (Signed)
     Subjective:  Patient reports pain as mild to moderate.  POD1 from LEFT tibial plateau fracture s/p external fixation closed reduction and fasciotomy.  Pt states he did not sleep well last night, unable to get comfortable and monitors kept beeping.  Pain regimen helping, requesting something for sleep.  Denies numbness, tingling, or burning sensation.  Able to wiggle toes.    Planning to transfer to [facility] with anticipated next-step surgical intervention in next few days.  CT left knee ordered per Dr. Blanchie Dessert.  Discussed this with patient and family member at bedside -- they are aware.  Objective:   VITALS:   Vitals:   01/02/23 0230 01/02/23 0245 01/02/23 0312 01/02/23 0507  BP: (!) 148/104 (!) 154/110 (!) 149/101 (!) 133/101  Pulse: 82 94 76 70  Resp: 17 17 18 18   Temp:  98 F (36.7 C) 99.1 F (37.3 C) (!) 97.4 F (36.3 C)  TempSrc:   Oral Oral  SpO2: 99% 100% 99% 100%  Weight:      Height:        Neurovascular intact Sensation intact distally Intact pulses distally Dorsiflexion/Plantar flexion intact, no significant pain/discomfort with this Incision: dressing C/D/I Compartment soft External fixation components present Able to wiggle toes appropriately  Lab Results  Component Value Date   WBC 12.0 (H) 01/01/2023   HGB 12.3 (L) 01/01/2023   HCT 37.5 (L) 01/01/2023   MCV 86.0 01/01/2023   PLT 320 01/01/2023   BMET    Component Value Date/Time   NA 135 01/01/2023 2009   K 3.2 (L) 01/01/2023 2009   CL 104 01/01/2023 2009   CO2 22 01/01/2023 2009   GLUCOSE 99 01/01/2023 2009   BUN 8 01/01/2023 2009   CREATININE 0.89 01/01/2023 2009   CALCIUM 8.1 (L) 01/01/2023 2009   GFRNONAA >60 01/01/2023 2009     Xray left knee: external fixation of comminuted left tibial plateau fracture, components appear in place appropriately.  CT left knee -- pending.  Assessment/Plan: 1 Day Post-Op  Left proximal tibial toe fracture, acute compartment syndrome    Procedure: 4 compartment left lower extremity fasciotomy, application of knee spanning external fixator with closed reduction of the tibial plateau fracture, application of wound VAC to the medial lateral fasciotomy sites   Principal Problem:   Compartment syndrome of lower leg (HCC)   Post op recs: WB: NWB LLE Abx: ancef x23 hours post op Imaging: PACU xrays Dressing: keep intact until follow up, change PRN if soiled or saturated.  Wound VAC x 2 to 125 mmHg continuous. DVT prophylaxis: lovenox starting POD1  Added melatonin PRN at night time.  Pt may utilize robaxin PRN as well. Continue with pain regimen. Labs: Mild elevated WBC following surgery, likely post-operative leukocytosis.  Mild post-operative anemia, stable.  Follow up: Patient scheduled to transfer to [facility] for continued care.  Proceed with CT left knee.  Plan for return to the OR in 2 to 3 days to check compartment swelling and washout the fasciotomy sites with possible wound closure.  Address: [address], [place], [place] [address]  Office Phone: [phone]   Cecil Cobbs 01/02/2023, 6:42 AM   Weber Cooks, MD  Contact information:   [phone] 7am-5pm epic message Dr. Blanchie Dessert, or call office for patient follow up: [phone] After hours and holidays please check [email or web address] for group call information for [facility]
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
## 2023-01-02 NOTE — Progress Notes (Signed)
Pt is agitated this morning, states that he wants to take something for sleep and that he has not slept.  Pt was given ambien at 345 am, and was observed sleeping since arrival on unit.  Pt states that he wants to go outside and smoke, told him that that was not an option and asked if he would like a nicotine patch.  He said no, became agitated about not going outside.  I attempted to explain the patient hospital procedure and he began to cuss more.  Told patient this would not be tolerated and left the room.  Pt could benefit from nicotine patch and possible CIWA protocol.

## 2023-01-03 ENCOUNTER — Encounter (HOSPITAL_COMMUNITY): Payer: Self-pay | Admitting: Orthopedic Surgery

## 2023-01-03 LAB — CBC
HCT: 32.8 % — ABNORMAL LOW (ref 39.0–52.0)
Hemoglobin: 10.7 g/dL — ABNORMAL LOW (ref 13.0–17.0)
MCH: 28.1 pg (ref 26.0–34.0)
MCHC: 32.6 g/dL (ref 30.0–36.0)
MCV: 86.1 fL (ref 80.0–100.0)
Platelets: 303 10*3/uL (ref 150–400)
RBC: 3.81 MIL/uL — ABNORMAL LOW (ref 4.22–5.81)
RDW: 14.1 % (ref 11.5–15.5)
WBC: 14.6 10*3/uL — ABNORMAL HIGH (ref 4.0–10.5)
nRBC: 0 % (ref 0.0–0.2)

## 2023-01-03 NOTE — TOC Progression Note (Signed)
Transition of Care [facility]) - Progression Note    Patient Details  Name: Arthur Ramos MRN: [id number] Date of Birth: 30-Apr-1996  Transition of Care [facility]) CM/SW Contact  Beckie Busing, RN Phone Number:[phone]  01/03/2023, 12:34 AM  Clinical Narrative:     Transition of Care [facility]) Screening Note   Patient Details  Name: Arthur Ramos Date of Birth: November 10, 1995   Transition of Care [facility]) CM/SW Contact:    Beckie Busing, RN Phone Number: 01/03/2023, 12:35 AM    Transition of Care Department [facility]) has reviewed patient and no TOC needs have been identified at this time. We will continue to monitor patient advancement through interdisciplinary progression rounds. If new patient transition needs arise, please place a TOC consult.          Expected Discharge Plan and Services                                               Social Determinants of Health (SDOH) Interventions SDOH Screenings   Food Insecurity: No Food Insecurity (01/02/2023)  Housing: Low Risk  (01/02/2023)  Transportation Needs: No Transportation Needs (01/02/2023)  Utilities: Not At Risk (01/02/2023)  Tobacco Use: High Risk (01/02/2023)    Readmission Risk Interventions     No data to display
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
## 2023-01-03 NOTE — Plan of Care (Signed)
  Problem: Education: Goal: Knowledge of General Education information will improve Description Including pain rating scale, medication(s)/side effects and non-pharmacologic comfort measures Outcome: Progressing   Problem: Health Behavior/Discharge Planning: Goal: Ability to manage health-related needs will improve Outcome: Progressing   

## 2023-01-03 NOTE — Progress Notes (Signed)
ORTHO TRAUMA PROGRESS NOTE  Subjective:  Patient reports pain as mild to moderate.  Ex-Fix making it difficult for him to find a comfortable position to sleep in bed.  Denies numbness, tingling, or burning sensation.  Able to wiggle toes.    Objective:   VITALS:   Vitals:   01/02/23 1317 01/02/23 1812 01/02/23 2120 01/03/23 0455  BP: (!) 157/95 (!) 137/93 139/89 105/85  Pulse: 83 95 77 86  Resp:      Temp: 99.2 F (37.3 C) 99.9 F (37.7 C) 98.8 F (37.1 C) 98.3 F (36.8 C)  TempSrc: Oral Oral Oral Oral  SpO2: 100% 100% 100% 99%  Weight:      Height:        Neurovascular intact Sensation intact distally Intact pulses distally Dorsiflexion/Plantar flexion intact, no significant pain/discomfort with this Incision: dressing C/D/I Compartment soft External fixation components present Able to wiggle toes appropriately  Lab Results  Component Value Date   WBC 14.6 (H) 01/03/2023   HGB 10.7 (L) 01/03/2023   HCT 32.8 (L) 01/03/2023   MCV 86.1 01/03/2023   PLT 303 01/03/2023   BMET    Component Value Date/Time   NA 132 (L) 01/02/2023 0636   K 3.8 01/02/2023 0636   CL 99 01/02/2023 0636   CO2 22 01/02/2023 0636   GLUCOSE 146 (H) 01/02/2023 0636   BUN 7 01/02/2023 0636   CREATININE 0.85 01/02/2023 0636   CALCIUM 8.6 (L) 01/02/2023 0636   GFRNONAA >60 01/02/2023 0636     Xray left knee: external fixation of comminuted left tibial plateau fracture, components appear in place appropriately.  CT left knee: Confirms comminuted bicondylar tibial plateau fracture  Assessment/Plan: 2 Days Post-Op  Left proximal tibial toe fracture, acute compartment syndrome   Procedure: 4 compartment left lower extremity fasciotomy, application of knee spanning external fixator with closed reduction of the tibial plateau fracture, application of wound VAC to the medial lateral fasciotomy sites   Principal Problem:   Compartment syndrome of lower leg (HCC)   Post op recs: WB: NWB  LLE Abx: ancef x23 hours post op Imaging: PACU xrays Dressing: keep intact until follow up, change PRN if soiled or saturated.  Wound VAC x 2 to 125 mmHg continuous. DVT prophylaxis: lovenox  Added melatonin PRN at night time.  Pt may utilize robaxin PRN as well. Continue with pain regimen. Labs: Mild elevated WBC following surgery, likely post-operative leukocytosis.  Mild post-operative anemia, stable.  Follow up: Patient scheduled to transfer to [facility] for continued care.  Proceed with CT left knee.  Plan for return to the OR Monday, 01/05/2023 to check compartment swelling and washout the fasciotomy sites with possible wound closure.  Thompson Caul PA-C [facility] [phone] (office) [email or web address]     Contact information:   Weekdays 7am-5pm epic message Dr. Blanchie Dessert, or call office for patient follow up: [phone] After hours and holidays please check [email or web address] for group call information for [facility]

## 2023-01-04 LAB — VITAMIN D 25 HYDROXY (VIT D DEFICIENCY, FRACTURES): Vit D, 25-Hydroxy: 6.89 ng/mL — ABNORMAL LOW (ref 30–100)

## 2023-01-04 MED ORDER — KETOROLAC TROMETHAMINE 15 MG/ML IJ SOLN
15.0000 mg | Freq: Four times a day (QID) | INTRAMUSCULAR | Status: AC
  Administered 2023-01-04 – 2023-01-05 (×5): 15 mg via INTRAVENOUS
  Filled 2023-01-04 (×5): qty 1

## 2023-01-04 MED ORDER — ACETAMINOPHEN 500 MG PO TABS
1000.0000 mg | ORAL_TABLET | Freq: Four times a day (QID) | ORAL | Status: DC
  Administered 2023-01-04 – 2023-01-10 (×17): 1000 mg via ORAL
  Filled 2023-01-04 (×22): qty 2

## 2023-01-04 NOTE — Progress Notes (Signed)
LLE dressing saturated through ace wrap, reinforced x2 during the shift. Both canisters changed 300 mL in each. Wound vac remains on continuous suction 125. Pain management effective.

## 2023-01-04 NOTE — Progress Notes (Signed)
Orthopaedic Trauma Progress Note  SUBJECTIVE: Doing okay today.  Slept better last night than he has since has been in the hospital.  Still having a difficult time with pain control.  I have scheduled Tylenol and added Toradol for better pain control.  No chest pain. No SOB. No nausea/vomiting. No other complaints.  Patient asking if he is outside today  OBJECTIVE:  Vitals:   01/04/23 0418 01/04/23 0510  BP: (!) 149/110 (!) 152/94  Pulse: 98 96  Resp: 17   Temp: 98 F (36.7 C)   SpO2: 100%     General: Sitting up in bed, no acute distress Respiratory: No increased work of breathing.  Left lower extremity: Distal pin site dressing with mild serosanguineous drainage.  Proximal pin site dressing clean, dry, intact.  Remainder of dressing stable.  Wound VAC canisters with 150 and 250 mL serosanguineous drainage.  Endorses sensation to light touch over the dorsal and plantar aspect of the foot.  Able to wiggle the toes.  No significant tenderness to the thigh.  IMAGING: Stable postreduction OR films left tibial plateau  LABS: No results found for this or any previous visit (from the past 24 hour(s)).  ASSESSMENT: FABRIZZIO GELHAR is a 27 y.o. male, 3 Days Post-Op s/p LEG FASCIOTOMY, EXTERNAL FIXATION APPLICATION CLOSED REDUCTION APPLICATION OF WOUND VAC X2  CV/Blood loss: Acute blood loss anemia, Hgb 10.7 on 01/03/2023. Hemodynamically stable  PLAN: Weightbearing: NWB LLE ROM: Ok for hip motion. Wiggle toes  Incisional and dressing care: Reinforce dressings as needed  Showering:  Hold off for now Orthopedic device(s): Wound Vac:LLE and Ex-fix   Pain management:  1. Tylenol 325-650 mg q 6 hours PRN 2. Robaxin 500 mg q 6 hours PRN 3. Oxycodone 5-10 mg q 4 hours PRN 4. Dilaudid 0.5-1 mg q 4 hours PRN VTE prophylaxis: Lovenox, SCDs ID:  Ancef 2gm post op Foley/Lines:  No foley, KVO IVFs Impediments to Fracture Healing: Vit D level pending, will start supplementation as  indicted Dispo: PT/OT as able. Plan to return to OR tomorrow for wound vac change, possible wound closure with Dr. Jena Gauss. Please keep NPO at midnight.  Patient okay to go outside today if staff available to take him.  Follow - up plan: TBD   Contact information:  Truitt Merle MD, Thyra Breed PA-C. After hours and holidays please check [email or web address] for group call information for [facility]   Thompson Caul, PA-C [phone] (office) [email or web address]

## 2023-01-04 NOTE — Plan of Care (Signed)

## 2023-01-05 ENCOUNTER — Encounter (HOSPITAL_COMMUNITY)
Admission: EM | Disposition: A | Discharge status: Home / Self Care | Payer: Self-pay | Source: Home / Self Care | Attending: Student

## 2023-01-05 ENCOUNTER — Inpatient Hospital Stay (HOSPITAL_COMMUNITY): Payer: Self-pay | Admitting: Certified Registered"

## 2023-01-05 ENCOUNTER — Encounter (HOSPITAL_COMMUNITY): Payer: Self-pay | Admitting: Orthopedic Surgery

## 2023-01-05 ENCOUNTER — Other Ambulatory Visit: Payer: Self-pay

## 2023-01-05 DIAGNOSIS — S82142A Displaced bicondylar fracture of left tibia, initial encounter for closed fracture: Secondary | ICD-10-CM

## 2023-01-05 DIAGNOSIS — D649 Anemia, unspecified: Secondary | ICD-10-CM

## 2023-01-05 DIAGNOSIS — F17210 Nicotine dependence, cigarettes, uncomplicated: Secondary | ICD-10-CM

## 2023-01-05 HISTORY — PX: SECONDARY CLOSURE OF WOUND: SHX6208

## 2023-01-05 SURGERY — SECONDARY CLOSURE OF WOUND
Anesthesia: General | Site: Leg Lower | Laterality: Left

## 2023-01-05 MED ORDER — DEXAMETHASONE SODIUM PHOSPHATE 10 MG/ML IJ SOLN
INTRAMUSCULAR | Status: AC
  Filled 2023-01-05: qty 1

## 2023-01-05 MED ORDER — 0.9 % SODIUM CHLORIDE (POUR BTL) OPTIME
TOPICAL | Status: DC | PRN
  Administered 2023-01-05: 1000 mL

## 2023-01-05 MED ORDER — ACETAMINOPHEN 10 MG/ML IV SOLN
1000.0000 mg | Freq: Once | INTRAVENOUS | Status: DC | PRN
  Administered 2023-01-05: 1000 mg via INTRAVENOUS

## 2023-01-05 MED ORDER — PROPOFOL 10 MG/ML IV BOLUS
INTRAVENOUS | Status: AC
  Filled 2023-01-05: qty 20

## 2023-01-05 MED ORDER — ORAL CARE MOUTH RINSE: 15.0000 mL | Freq: Once | OROMUCOSAL | Status: AC

## 2023-01-05 MED ORDER — LACTATED RINGERS IV SOLN: INTRAVENOUS | Status: DC

## 2023-01-05 MED ORDER — OXYCODONE HCL 5 MG/5ML PO SOLN: 5.0000 mg | Freq: Once | ORAL | Status: DC | PRN

## 2023-01-05 MED ORDER — ONDANSETRON HCL 4 MG/2ML IJ SOLN
INTRAMUSCULAR | Status: DC | PRN
  Administered 2023-01-05: 4 mg via INTRAVENOUS

## 2023-01-05 MED ORDER — DEXMEDETOMIDINE HCL IN NACL 80 MCG/20ML IV SOLN
INTRAVENOUS | Status: DC | PRN
  Administered 2023-01-05: 8 ug via INTRAVENOUS

## 2023-01-05 MED ORDER — CHLORHEXIDINE GLUCONATE 0.12 % MT SOLN
15.0000 mL | Freq: Once | OROMUCOSAL | Status: AC
  Administered 2023-01-05: 15 mL via OROMUCOSAL
  Filled 2023-01-05: qty 15

## 2023-01-05 MED ORDER — MIDAZOLAM HCL 2 MG/2ML IJ SOLN
INTRAMUSCULAR | Status: DC | PRN
  Administered 2023-01-05: 2 mg via INTRAVENOUS

## 2023-01-05 MED ORDER — ACETAMINOPHEN 160 MG/5ML PO SOLN: 1000.0000 mg | Freq: Once | ORAL | Status: DC | PRN

## 2023-01-05 MED ORDER — ROCURONIUM BROMIDE 10 MG/ML (PF) SYRINGE
PREFILLED_SYRINGE | INTRAVENOUS | Status: DC | PRN
  Administered 2023-01-05: 60 mg via INTRAVENOUS

## 2023-01-05 MED ORDER — ACETAMINOPHEN 10 MG/ML IV SOLN
INTRAVENOUS | Status: AC
  Filled 2023-01-05: qty 100

## 2023-01-05 MED ORDER — OXYCODONE HCL 5 MG PO TABS: 5.0000 mg | ORAL_TABLET | Freq: Once | ORAL | Status: DC | PRN

## 2023-01-05 MED ORDER — LIDOCAINE 2% (20 MG/ML) 5 ML SYRINGE
INTRAMUSCULAR | Status: AC
  Filled 2023-01-05: qty 5

## 2023-01-05 MED ORDER — ACETAMINOPHEN 500 MG PO TABS: 1000.0000 mg | ORAL_TABLET | Freq: Once | ORAL | Status: DC | PRN

## 2023-01-05 MED ORDER — DEXMEDETOMIDINE HCL IN NACL 80 MCG/20ML IV SOLN
INTRAVENOUS | Status: AC
  Filled 2023-01-05: qty 20

## 2023-01-05 MED ORDER — PROPOFOL 10 MG/ML IV BOLUS
INTRAVENOUS | Status: DC | PRN
  Administered 2023-01-05: 200 mg via INTRAVENOUS

## 2023-01-05 MED ORDER — FENTANYL CITRATE (PF) 250 MCG/5ML IJ SOLN
INTRAMUSCULAR | Status: AC
  Filled 2023-01-05: qty 5

## 2023-01-05 MED ORDER — LIDOCAINE 2% (20 MG/ML) 5 ML SYRINGE
INTRAMUSCULAR | Status: DC | PRN
  Administered 2023-01-05: 40 mg via INTRAVENOUS

## 2023-01-05 MED ORDER — FENTANYL CITRATE (PF) 100 MCG/2ML IJ SOLN
25.0000 ug | INTRAMUSCULAR | Status: DC | PRN
  Administered 2023-01-05 (×2): 50 ug via INTRAVENOUS

## 2023-01-05 MED ORDER — FENTANYL CITRATE (PF) 100 MCG/2ML IJ SOLN
INTRAMUSCULAR | Status: DC | PRN
  Administered 2023-01-05 (×2): 50 ug via INTRAVENOUS

## 2023-01-05 MED ORDER — FENTANYL CITRATE (PF) 100 MCG/2ML IJ SOLN
INTRAMUSCULAR | Status: AC
  Filled 2023-01-05: qty 2

## 2023-01-05 MED ORDER — MIDAZOLAM HCL 2 MG/2ML IJ SOLN
INTRAMUSCULAR | Status: AC
  Filled 2023-01-05: qty 2

## 2023-01-05 MED ORDER — DEXAMETHASONE SODIUM PHOSPHATE 10 MG/ML IJ SOLN
INTRAMUSCULAR | Status: DC | PRN
  Administered 2023-01-05: 5 mg via INTRAVENOUS

## 2023-01-05 MED ORDER — ONDANSETRON HCL 4 MG/2ML IJ SOLN
INTRAMUSCULAR | Status: AC
  Filled 2023-01-05: qty 2

## 2023-01-05 MED ORDER — SUGAMMADEX SODIUM 200 MG/2ML IV SOLN
INTRAVENOUS | Status: DC | PRN
  Administered 2023-01-05: 50 mg via INTRAVENOUS

## 2023-01-05 SURGICAL SUPPLY — 59 items
APL PRP STRL LF DISP 70% ISPRP (MISCELLANEOUS) ×1
BAG COUNTER SPONGE SURGICOUNT (BAG) ×1 IMPLANT
BAG SPNG CNTER NS LX DISP (BAG) ×1
BLADE SURG 10 STRL SS (BLADE) ×1 IMPLANT
BNDG CMPR 5X4 KNIT ELC UNQ LF (GAUZE/BANDAGES/DRESSINGS) ×2
BNDG CMPR 6"X 5 YARDS HK CLSR (GAUZE/BANDAGES/DRESSINGS) ×1
BNDG COHESIVE 4X5 TAN STRL (GAUZE/BANDAGES/DRESSINGS) ×1 IMPLANT
BNDG ELASTIC 4INX 5YD STR LF (GAUZE/BANDAGES/DRESSINGS) IMPLANT
BNDG ELASTIC 6INX 5YD STR LF (GAUZE/BANDAGES/DRESSINGS) IMPLANT
BNDG GAUZE DERMACEA FLUFF 4 (GAUZE/BANDAGES/DRESSINGS) ×5 IMPLANT
BNDG GZE DERMACEA 4 6PLY (GAUZE/BANDAGES/DRESSINGS) ×2
BRUSH SCRUB EZ PLAIN DRY (MISCELLANEOUS) ×2 IMPLANT
CANISTER WOUNDNEG PRESSURE 500 (CANNISTER) IMPLANT
CHLORAPREP W/TINT 26 (MISCELLANEOUS) ×1 IMPLANT
CONNECTOR Y WND VAC (MISCELLANEOUS) IMPLANT
COVER SURGICAL LIGHT HANDLE (MISCELLANEOUS) ×2 IMPLANT
DRAPE C-ARMOR (DRAPES) ×1 IMPLANT
DRAPE DERMATAC (DRAPES) IMPLANT
DRAPE INCISE IOBAN 66X45 STRL (DRAPES) IMPLANT
DRAPE U-SHAPE 47X51 STRL (DRAPES) ×1 IMPLANT
DRSG ADAPTIC 3X8 NADH LF (GAUZE/BANDAGES/DRESSINGS) ×1 IMPLANT
DRSG MEPITEL 4X7.2 (GAUZE/BANDAGES/DRESSINGS) IMPLANT
DRSG VAC GRANUFOAM MED (GAUZE/BANDAGES/DRESSINGS) IMPLANT
ELECT CAUTERY BLADE 6.4 (BLADE) IMPLANT
ELECT REM PT RETURN 9FT ADLT (ELECTROSURGICAL) ×1
ELECTRODE REM PT RTRN 9FT ADLT (ELECTROSURGICAL) IMPLANT
GAUZE PAD ABD 8X10 STRL (GAUZE/BANDAGES/DRESSINGS) IMPLANT
GAUZE SPONGE 4X4 12PLY STRL (GAUZE/BANDAGES/DRESSINGS) ×1 IMPLANT
GLOVE BIO SURGEON STRL SZ 6.5 (GLOVE) ×3 IMPLANT
GLOVE BIO SURGEON STRL SZ7.5 (GLOVE) ×4 IMPLANT
GLOVE BIOGEL PI IND STRL 6.5 (GLOVE) ×1 IMPLANT
GLOVE BIOGEL PI IND STRL 7.5 (GLOVE) ×1 IMPLANT
GOWN STRL REUS W/ TWL LRG LVL3 (GOWN DISPOSABLE) ×2 IMPLANT
GOWN STRL REUS W/TWL LRG LVL3 (GOWN DISPOSABLE) ×2
HANDPIECE INTERPULSE COAX TIP (DISPOSABLE)
KIT BASIN OR (CUSTOM PROCEDURE TRAY) ×1 IMPLANT
KIT TURNOVER KIT B (KITS) ×1 IMPLANT
MANIFOLD NEPTUNE II (INSTRUMENTS) ×1 IMPLANT
NS IRRIG 1000ML POUR BTL (IV SOLUTION) ×1 IMPLANT
PACK ORTHO EXTREMITY (CUSTOM PROCEDURE TRAY) ×1 IMPLANT
PAD ARMBOARD 7.5X6 YLW CONV (MISCELLANEOUS) ×2 IMPLANT
PAD CAST 4YDX4 CTTN HI CHSV (CAST SUPPLIES) IMPLANT
PAD NEG PRESSURE SENSATRAC (MISCELLANEOUS) IMPLANT
PADDING CAST COTTON 4X4 STRL (CAST SUPPLIES) ×1
PADDING CAST COTTON 6X4 STRL (CAST SUPPLIES) ×1 IMPLANT
SET HNDPC FAN SPRY TIP SCT (DISPOSABLE) IMPLANT
SPONGE T-LAP 18X18 ~~LOC~~+RFID (SPONGE) ×1 IMPLANT
STAPLER SKIN 35 WIDE (STAPLE) IMPLANT
STOCKINETTE IMPERVIOUS 9X36 MD (GAUZE/BANDAGES/DRESSINGS) ×1 IMPLANT
SUT ETHILON 3 0 FSLX (SUTURE) IMPLANT
SUT ETHILON O TP 1 (SUTURE) IMPLANT
SUT PDS AB 2-0 CT1 27 (SUTURE) IMPLANT
SWAB CULTURE ESWAB REG 1ML (MISCELLANEOUS) IMPLANT
TOWEL GREEN STERILE (TOWEL DISPOSABLE) ×2 IMPLANT
TOWEL GREEN STERILE FF (TOWEL DISPOSABLE) ×1 IMPLANT
TUBE CONNECTING 12X1/4 (SUCTIONS) ×1 IMPLANT
UNDERPAD 30X36 HEAVY ABSORB (UNDERPADS AND DIAPERS) ×1 IMPLANT
WATER STERILE IRR 1000ML POUR (IV SOLUTION) ×1 IMPLANT
YANKAUER SUCT BULB TIP NO VENT (SUCTIONS) ×1 IMPLANT

## 2023-01-05 NOTE — Interval H&P Note (Signed)
History and Physical Interval Note:  01/05/2023 8:26 AM  Arthur Ramos  has presented today for surgery, with the diagnosis of Left tibial plateau fracture.  The various methods of treatment have been discussed with the patient and family. After consideration of risks, benefits and other options for treatment, the patient has consented to  Procedure(s): WOUND VAC CHANGE AND POSSIBLE SECONDARY CLOSURE OF WOUND (Left) as a surgical intervention.  The patient's history has been reviewed, patient examined, no change in status, stable for surgery.  I have reviewed the patient's chart and labs.  Questions were answered to the patient's satisfaction.     Caryn Bee P Adit Riddles
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
## 2023-01-05 NOTE — Progress Notes (Signed)
LLE dressing saturated through abd pads and ace wrap twice. Pt c/o of increasing pain. Dressing reinforced. Pt still reports 10/10 pain after raising leg and holding to assist with dressing. Wound vac on cont suction at 125. Output documented in [facility] flowsheet. Good cap refill and sensation is intact.
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
## 2023-01-05 NOTE — Progress Notes (Signed)
PT Cancellation Note  Patient Details Name: DSHAUN DEETS MRN: [id number] DOB: [birth year]   Cancelled Treatment:    Reason Eval/Treat Not Completed: Other (comment).  Gone to surgery, will retry when pt is stable from the process.   Ivar Drape 01/05/2023, 10:31 AM  Samul Dada, PT PhD Acute Rehab Dept. Number: [facility] [id number] and [facility] [phone]

## 2023-01-05 NOTE — Anesthesia Preprocedure Evaluation (Signed)
Anesthesia Evaluation  Patient identified by MRN, date of birth, ID band Patient awake    Reviewed: Allergy & Precautions, NPO status , Patient's Chart, lab work & pertinent test results  Airway Mallampati: I       Dental  (+) Dental Advisory Given, Teeth Intact   Pulmonary neg shortness of breath, neg sleep apnea, neg COPD, neg recent URI, Current Smoker and Patient abstained from smoking.   breath sounds clear to auscultation       Cardiovascular negative cardio ROS  Rhythm:Regular     Neuro/Psych  PSYCHIATRIC DISORDERS      negative neurological ROS     GI/Hepatic negative GI ROS, Neg liver ROS,,,  Endo/Other  negative endocrine ROS    Renal/GU negative Renal ROS  negative genitourinary   Musculoskeletal Left tibial plateau fracture   Abdominal   Peds  Hematology  (+) Blood dyscrasia, anemia Lab Results      Component                Value               Date                      WBC                      14.6 (H)            01/03/2023                HGB                      10.7 (L)            01/03/2023                HCT                      32.8 (L)            01/03/2023                MCV                      86.1                01/03/2023                PLT                      303                 01/03/2023              Anesthesia Other Findings   Reproductive/Obstetrics                             Anesthesia Physical Anesthesia Plan  ASA: 2  Anesthesia Plan: General   Post-op Pain Management:    Induction: Intravenous  PONV Risk Score and Plan: 1 and Ondansetron, Dexamethasone, Treatment may vary due to age or medical condition and Midazolam  Airway Management Planned: Oral ETT  Additional Equipment: None  Intra-op Plan:   Post-operative Plan: Extubation in OR  Informed Consent: I have reviewed the patients History and Physical, chart, labs and discussed the  procedure including the risks, benefits and alternatives for the proposed anesthesia with the  patient or authorized representative who has indicated his/her understanding and acceptance.     Dental advisory given  Plan Discussed with: CRNA  Anesthesia Plan Comments:        Anesthesia Quick Evaluation

## 2023-01-05 NOTE — Transfer of Care (Signed)
Immediate Anesthesia Transfer of Care Note  Patient: Arthur Ramos  Procedure(s) Performed: WOUND VAC CHANGE AND SECONDARY CLOSURE OF WOUND (Left: Leg Lower)  Patient Location: PACU  Anesthesia Type:General  Level of Consciousness: awake and oriented  Airway & Oxygen Therapy: Patient Spontanous Breathing  Post-op Assessment: Report given to RN  Post vital signs: Reviewed and stable  Last Vitals:  Vitals Value Taken Time  BP 129/89 01/05/23 1010  Temp    Pulse 109 01/05/23 1012  Resp 19 01/05/23 1012  SpO2 100 % 01/05/23 1012  Vitals shown include unvalidated device data.  Last Pain:  Vitals:   01/05/23 0821  TempSrc:   PainSc: 10-Worst pain ever      Patients Stated Pain Goal: 0 (01/03/23 2202)  Complications: No notable events documented.

## 2023-01-05 NOTE — Anesthesia Procedure Notes (Signed)
Procedure Name: Intubation Date/Time: 01/05/2023 9:01 AM  Performed by: De Nurse, CRNAPre-anesthesia Checklist: Patient identified, Emergency Drugs available, Suction available and Patient being monitored Patient Re-evaluated:Patient Re-evaluated prior to induction Oxygen Delivery Method: Circle System Utilized Preoxygenation: Pre-oxygenation with 100% oxygen Induction Type: IV induction Ventilation: Mask ventilation without difficulty Laryngoscope Size: Mac and 4 Grade View: Grade I Tube type: Oral Tube size: 7.5 mm Number of attempts: 1 Airway Equipment and Method: Stylet and Oral airway Placement Confirmation: ETT inserted through vocal cords under direct vision, positive ETCO2 and breath sounds checked- equal and bilateral Secured at: 22 cm Tube secured with: Tape Dental Injury: Teeth and Oropharynx as per pre-operative assessment

## 2023-01-06 ENCOUNTER — Encounter (HOSPITAL_COMMUNITY): Payer: Self-pay | Admitting: Student

## 2023-01-06 MED ORDER — VITAMIN D (ERGOCALCIFEROL) 1.25 MG (50000 UNIT) PO CAPS: 50000.0000 [IU] | ORAL_CAPSULE | ORAL | Status: DC

## 2023-01-06 MED ORDER — VITAMIN D (ERGOCALCIFEROL) 1.25 MG (50000 UNIT) PO CAPS
50000.0000 [IU] | ORAL_CAPSULE | ORAL | Status: DC
  Administered 2023-01-06: 50000 [IU] via ORAL
  Filled 2023-01-06 (×2): qty 1
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
## 2023-01-06 NOTE — Progress Notes (Signed)
VAC cannister changed, operating properly.

## 2023-01-06 NOTE — Anesthesia Postprocedure Evaluation (Signed)
Anesthesia Post Note  Patient: Arthur Ramos  Procedure(s) Performed: WOUND VAC CHANGE AND SECONDARY CLOSURE OF WOUND (Left: Leg Lower)     Patient location during evaluation: PACU Anesthesia Type: General Level of consciousness: awake and alert Pain management: pain level controlled Vital Signs Assessment: post-procedure vital signs reviewed and stable Respiratory status: spontaneous breathing, nonlabored ventilation and respiratory function stable Cardiovascular status: blood pressure returned to baseline and stable Postop Assessment: no apparent nausea or vomiting Anesthetic complications: no   No notable events documented.  Last Vitals:  Vitals:   01/05/23 2140 01/06/23 0556  BP: 127/74 (!) 120/95  Pulse: (!) 106 90  Resp: 18   Temp:  36.6 C  SpO2: 100% 100%    Last Pain:  Vitals:   01/06/23 0556  TempSrc: Oral  PainSc:                  Sukhman Kocher

## 2023-01-06 NOTE — Progress Notes (Signed)
Orthopaedic Trauma Progress Note  SUBJECTIVE: Patient received pain medication around 545 this morning, patient unable to get some rest and is currently sleeping comfortably in bed.  I did not wake him for exam as she has had difficulty sleeping over the last several days. No acute events noted overnight.    OBJECTIVE:  Vitals:   01/05/23 2140 01/06/23 0556  BP: 127/74 (!) 120/95  Pulse: (!) 106 90  Resp: 18   Temp:  97.8 F (36.6 C)  SpO2: 100% 100%    General: Sleeping.  No acute distress Respiratory: No increased work of breathing.  Left lower extremity: Dressings clean, dry, intact.    IMAGING: Stable postreduction OR films left tibial plateau  LABS: No results found for this or any previous visit (from the past 24 hour(s)).  ASSESSMENT: Arthur Ramos is a 27 y.o. male, 1 Day Post-Op s/p LEG FASCIOTOMY, EXTERNAL FIXATION APPLICATION CLOSED REDUCTION APPLICATION OF WOUND VAC X2  CV/Blood loss: Acute blood loss anemia, Hgb 10.7 on 01/03/2023. Hemodynamically stable  PLAN: Weightbearing: NWB LLE ROM: Ok for hip motion. Wiggle toes  Incisional and dressing care: Reinforce dressings as needed  Showering:  Hold off for now Orthopedic device(s): Wound Vac:LLE and Ex-fix   Pain management:  1. Tylenol 325-650 mg q 6 hours PRN 2. Robaxin 500 mg q 6 hours PRN 3. Oxycodone 5-10 mg q 4 hours PRN 4. Dilaudid 0.5-1 mg q 4 hours PRN VTE prophylaxis: Lovenox, SCDs ID:  Ancef 2gm post op Foley/Lines:  No foley, KVO IVFs Impediments to Fracture Healing: Vit D level 6, started on supplementation Dispo: PT/OT evaluation ongoing. Plan to return to OR tomorrow for potential wound VAC change versus ORIF of tibial plateau with Dr. Jena Gauss. Please keep NPO at midnight.  Patient okay to go outside if staff available to take him.  Follow - up plan: TBD   Contact information:  Truitt Merle MD, Thyra Breed PA-C. After hours and holidays please check [email or web address] for group call information for  [facility]   Thompson Caul, PA-C [phone] (office) [email or web address]

## 2023-01-06 NOTE — TOC CAGE-AID Note (Signed)
Transition of Care [facility]) - CAGE-AID Screening   Patient Details  Name: Arthur Ramos MRN: [id number] Date of Birth: 1996/02/07  Transition of Care [facility]) CM/SW Contact:    Katha Hamming, RN Phone Number:[phone] 01/06/2023, 9:20 PM    CAGE-AID Screening:    Have You Ever Felt You Ought to Cut Down on Your Drinking or Drug Use?: No Have People Annoyed You By Critizing Your Drinking Or Drug Use?: No Have You Felt Bad Or Guilty About Your Drinking Or Drug Use?: No Have You Ever Had a Drink or Used Drugs First Thing In The Morning to Steady Your Nerves or to Get Rid of a Hangover?: No CAGE-AID Score: 0  Substance Abuse Education Offered: No (hx of alcohol abuse, no longer drinking)

## 2023-01-07 ENCOUNTER — Inpatient Hospital Stay (HOSPITAL_COMMUNITY): Payer: Self-pay | Admitting: Anesthesiology

## 2023-01-07 ENCOUNTER — Inpatient Hospital Stay (HOSPITAL_COMMUNITY): Payer: Self-pay

## 2023-01-07 ENCOUNTER — Encounter (HOSPITAL_COMMUNITY): Payer: Self-pay | Admitting: Orthopedic Surgery

## 2023-01-07 ENCOUNTER — Encounter (HOSPITAL_COMMUNITY)
Admission: EM | Disposition: A | Discharge status: Home / Self Care | Payer: Self-pay | Source: Home / Self Care | Attending: Student

## 2023-01-07 ENCOUNTER — Other Ambulatory Visit: Payer: Self-pay

## 2023-01-07 DIAGNOSIS — S82142A Displaced bicondylar fracture of left tibia, initial encounter for closed fracture: Secondary | ICD-10-CM

## 2023-01-07 DIAGNOSIS — F319 Bipolar disorder, unspecified: Secondary | ICD-10-CM

## 2023-01-07 DIAGNOSIS — T79A22A Traumatic compartment syndrome of left lower extremity, initial encounter: Secondary | ICD-10-CM

## 2023-01-07 DIAGNOSIS — F17210 Nicotine dependence, cigarettes, uncomplicated: Secondary | ICD-10-CM

## 2023-01-07 DIAGNOSIS — F419 Anxiety disorder, unspecified: Secondary | ICD-10-CM

## 2023-01-07 HISTORY — PX: SECONDARY CLOSURE OF WOUND: SHX6208

## 2023-01-07 HISTORY — PX: ORIF TIBIA PLATEAU: SHX2132

## 2023-01-07 LAB — SURGICAL PCR SCREEN
MRSA, PCR: NEGATIVE
Staphylococcus aureus: NEGATIVE

## 2023-01-07 SURGERY — SECONDARY CLOSURE OF WOUND
Anesthesia: General | Laterality: Left

## 2023-01-07 MED ORDER — VANCOMYCIN HCL 1000 MG IV SOLR
INTRAVENOUS | Status: DC | PRN
  Administered 2023-01-07: 1000 mg

## 2023-01-07 MED ORDER — MIDAZOLAM HCL 2 MG/2ML IJ SOLN
INTRAMUSCULAR | Status: AC
  Filled 2023-01-07: qty 2

## 2023-01-07 MED ORDER — OXYCODONE HCL 5 MG PO TABS
5.0000 mg | ORAL_TABLET | ORAL | Status: DC | PRN
  Filled 2023-01-07 (×5): qty 2

## 2023-01-07 MED ORDER — METHOCARBAMOL 500 MG PO TABS
ORAL_TABLET | ORAL | Status: AC
  Administered 2023-01-07: 500 mg via ORAL
  Filled 2023-01-07: qty 1

## 2023-01-07 MED ORDER — HYDROMORPHONE HCL 1 MG/ML IJ SOLN
0.2500 mg | INTRAMUSCULAR | Status: DC | PRN
  Administered 2023-01-07 (×3): 0.5 mg via INTRAVENOUS

## 2023-01-07 MED ORDER — PROPOFOL 10 MG/ML IV BOLUS
INTRAVENOUS | Status: AC
  Filled 2023-01-07: qty 20

## 2023-01-07 MED ORDER — ROCURONIUM BROMIDE 10 MG/ML (PF) SYRINGE
PREFILLED_SYRINGE | INTRAVENOUS | Status: DC | PRN
  Administered 2023-01-07: 10 mg via INTRAVENOUS
  Administered 2023-01-07: 60 mg via INTRAVENOUS

## 2023-01-07 MED ORDER — ONDANSETRON HCL 4 MG/2ML IJ SOLN
INTRAMUSCULAR | Status: DC | PRN
  Administered 2023-01-07: 4 mg via INTRAVENOUS

## 2023-01-07 MED ORDER — CEFAZOLIN SODIUM-DEXTROSE 2-3 GM-%(50ML) IV SOLR
INTRAVENOUS | Status: DC | PRN
  Administered 2023-01-07: 2 g via INTRAVENOUS

## 2023-01-07 MED ORDER — 0.9 % SODIUM CHLORIDE (POUR BTL) OPTIME
TOPICAL | Status: DC | PRN
  Administered 2023-01-07: 1000 mL

## 2023-01-07 MED ORDER — CEFAZOLIN SODIUM-DEXTROSE 2-4 GM/100ML-% IV SOLN
INTRAVENOUS | Status: AC
  Filled 2023-01-07: qty 100

## 2023-01-07 MED ORDER — PROPOFOL 10 MG/ML IV BOLUS
INTRAVENOUS | Status: DC | PRN
  Administered 2023-01-07: 160 mg via INTRAVENOUS

## 2023-01-07 MED ORDER — CHLORHEXIDINE GLUCONATE 0.12 % MT SOLN
OROMUCOSAL | Status: AC
  Administered 2023-01-07: 15 mL via OROMUCOSAL
  Filled 2023-01-07: qty 15

## 2023-01-07 MED ORDER — METOCLOPRAMIDE HCL 5 MG PO TABS: 5.0000 mg | ORAL_TABLET | Freq: Three times a day (TID) | ORAL | Status: DC | PRN

## 2023-01-07 MED ORDER — KETOROLAC TROMETHAMINE 15 MG/ML IJ SOLN
15.0000 mg | Freq: Four times a day (QID) | INTRAMUSCULAR | Status: AC
  Administered 2023-01-07 – 2023-01-08 (×4): 15 mg via INTRAVENOUS
  Filled 2023-01-07 (×4): qty 1

## 2023-01-07 MED ORDER — FENTANYL CITRATE (PF) 100 MCG/2ML IJ SOLN
INTRAMUSCULAR | Status: AC
  Filled 2023-01-07: qty 2

## 2023-01-07 MED ORDER — SUGAMMADEX SODIUM 200 MG/2ML IV SOLN
INTRAVENOUS | Status: DC | PRN
  Administered 2023-01-07 (×4): 50 mg via INTRAVENOUS

## 2023-01-07 MED ORDER — DEXAMETHASONE SODIUM PHOSPHATE 10 MG/ML IJ SOLN
INTRAMUSCULAR | Status: DC | PRN
  Administered 2023-01-07: 5 mg via INTRAVENOUS

## 2023-01-07 MED ORDER — DEXMEDETOMIDINE HCL IN NACL 200 MCG/50ML IV SOLN
INTRAVENOUS | Status: DC | PRN
  Administered 2023-01-07: 4 ug via INTRAVENOUS
  Administered 2023-01-07: 8 ug via INTRAVENOUS
  Administered 2023-01-07: 4 ug via INTRAVENOUS
  Administered 2023-01-07: 20 ug via INTRAVENOUS

## 2023-01-07 MED ORDER — FENTANYL CITRATE (PF) 250 MCG/5ML IJ SOLN
INTRAMUSCULAR | Status: DC | PRN
  Administered 2023-01-07: 50 ug via INTRAVENOUS
  Administered 2023-01-07 (×2): 25 ug via INTRAVENOUS
  Administered 2023-01-07: 100 ug via INTRAVENOUS
  Administered 2023-01-07: 25 ug via INTRAVENOUS
  Administered 2023-01-07: 250 ug via INTRAVENOUS
  Administered 2023-01-07: 25 ug via INTRAVENOUS

## 2023-01-07 MED ORDER — FENTANYL CITRATE (PF) 100 MCG/2ML IJ SOLN
25.0000 ug | INTRAMUSCULAR | Status: DC | PRN
  Administered 2023-01-07: 50 ug via INTRAVENOUS

## 2023-01-07 MED ORDER — METHOCARBAMOL 500 MG PO TABS: 500.0000 mg | ORAL_TABLET | Freq: Three times a day (TID) | ORAL | Status: DC

## 2023-01-07 MED ORDER — LIDOCAINE 2% (20 MG/ML) 5 ML SYRINGE
INTRAMUSCULAR | Status: DC | PRN
  Administered 2023-01-07: 60 mg via INTRAVENOUS

## 2023-01-07 MED ORDER — VANCOMYCIN HCL 1000 MG IV SOLR
INTRAVENOUS | Status: AC
  Filled 2023-01-07: qty 20

## 2023-01-07 MED ORDER — ACETAMINOPHEN 500 MG PO TABS
1000.0000 mg | ORAL_TABLET | ORAL | Status: AC
  Administered 2023-01-07: 1000 mg via ORAL

## 2023-01-07 MED ORDER — DOCUSATE SODIUM 100 MG PO CAPS
100.0000 mg | ORAL_CAPSULE | Freq: Two times a day (BID) | ORAL | Status: DC
  Administered 2023-01-07 – 2023-01-09 (×5): 100 mg via ORAL
  Filled 2023-01-07 (×7): qty 1

## 2023-01-07 MED ORDER — FENTANYL CITRATE (PF) 250 MCG/5ML IJ SOLN
INTRAMUSCULAR | Status: AC
  Filled 2023-01-07: qty 5

## 2023-01-07 MED ORDER — CEFAZOLIN SODIUM-DEXTROSE 2-4 GM/100ML-% IV SOLN
2.0000 g | Freq: Three times a day (TID) | INTRAVENOUS | Status: AC
  Administered 2023-01-07 – 2023-01-08 (×3): 2 g via INTRAVENOUS
  Filled 2023-01-07 (×3): qty 100

## 2023-01-07 MED ORDER — OXYCODONE HCL 5 MG PO TABS
10.0000 mg | ORAL_TABLET | ORAL | Status: DC | PRN
  Administered 2023-01-07 (×2): 15 mg via ORAL
  Administered 2023-01-07 – 2023-01-08 (×4): 10 mg via ORAL
  Administered 2023-01-09 (×3): 15 mg via ORAL
  Administered 2023-01-09: 10 mg via ORAL
  Administered 2023-01-10 (×2): 15 mg via ORAL
  Filled 2023-01-07 (×7): qty 3
  Filled 2023-01-07: qty 2

## 2023-01-07 MED ORDER — CHLORHEXIDINE GLUCONATE 0.12 % MT SOLN: 15.0000 mL | Freq: Once | OROMUCOSAL | Status: AC

## 2023-01-07 MED ORDER — HYDROMORPHONE HCL 1 MG/ML IJ SOLN
INTRAMUSCULAR | Status: AC
  Administered 2023-01-07: 0.5 mg via INTRAVENOUS
  Filled 2023-01-07: qty 2

## 2023-01-07 MED ORDER — LACTATED RINGERS IV SOLN: INTRAVENOUS | Status: DC

## 2023-01-07 MED ORDER — SODIUM CHLORIDE 0.9 % IV SOLN: INTRAVENOUS | Status: DC

## 2023-01-07 MED ORDER — ORAL CARE MOUTH RINSE: 15.0000 mL | Freq: Once | OROMUCOSAL | Status: AC

## 2023-01-07 MED ORDER — ACETAMINOPHEN 10 MG/ML IV SOLN: 1000.0000 mg | Freq: Once | INTRAVENOUS | Status: DC | PRN

## 2023-01-07 MED ORDER — METOCLOPRAMIDE HCL 5 MG/ML IJ SOLN: 5.0000 mg | Freq: Three times a day (TID) | INTRAMUSCULAR | Status: DC | PRN

## 2023-01-07 MED ORDER — HYDROMORPHONE HCL 1 MG/ML IJ SOLN
0.5000 mg | Freq: Once | INTRAMUSCULAR | Status: AC
  Administered 2023-01-07: 0.5 mg via INTRAVENOUS

## 2023-01-07 SURGICAL SUPPLY — 96 items
APL PRP STRL LF DISP 70% ISPRP (MISCELLANEOUS) ×2
BAG COUNTER SPONGE SURGICOUNT (BAG) ×1 IMPLANT
BAG SPNG CNTER NS LX DISP (BAG) ×1
BANDAGE ESMARK 6X9 LF (GAUZE/BANDAGES/DRESSINGS) ×1 IMPLANT
BIT DRILL CALIBR QC 2.8X250 (BIT) IMPLANT
BIT DRILL QC SFS 2.5X170 (BIT) IMPLANT
BLADE CLIPPER SURG (BLADE) IMPLANT
BLADE SURG 10 STRL SS (BLADE) ×1 IMPLANT
BLADE SURG 15 STRL LF DISP TIS (BLADE) ×1 IMPLANT
BLADE SURG 15 STRL SS (BLADE) ×2
BNDG CMPR 5X4 KNIT ELC UNQ LF (GAUZE/BANDAGES/DRESSINGS) ×1
BNDG CMPR 6"X 5 YARDS HK CLSR (GAUZE/BANDAGES/DRESSINGS) ×1
BNDG CMPR 9X6 STRL LF SNTH (GAUZE/BANDAGES/DRESSINGS)
BNDG COHESIVE 4X5 TAN STRL (GAUZE/BANDAGES/DRESSINGS) ×1 IMPLANT
BNDG ELASTIC 4INX 5YD STR LF (GAUZE/BANDAGES/DRESSINGS) IMPLANT
BNDG ELASTIC 4X5.8 VLCR STR LF (GAUZE/BANDAGES/DRESSINGS) ×1 IMPLANT
BNDG ELASTIC 6INX 5YD STR LF (GAUZE/BANDAGES/DRESSINGS) IMPLANT
BNDG ELASTIC 6X5.8 VLCR STR LF (GAUZE/BANDAGES/DRESSINGS) ×1 IMPLANT
BNDG ESMARK 6X9 LF (GAUZE/BANDAGES/DRESSINGS)
BNDG GAUZE DERMACEA FLUFF 4 (GAUZE/BANDAGES/DRESSINGS) ×5 IMPLANT
BNDG GZE DERMACEA 4 6PLY (GAUZE/BANDAGES/DRESSINGS)
BRUSH SCRUB EZ PLAIN DRY (MISCELLANEOUS) ×2 IMPLANT
CANISTER SUCT 3000ML PPV (MISCELLANEOUS) ×1 IMPLANT
CHLORAPREP W/TINT 26 (MISCELLANEOUS) ×2 IMPLANT
COVER SURGICAL LIGHT HANDLE (MISCELLANEOUS) ×2 IMPLANT
CUFF TOURN SGL QUICK 34 (TOURNIQUET CUFF) ×1
CUFF TRNQT CYL 34X4.125X (TOURNIQUET CUFF) ×1 IMPLANT
DRAPE C-ARM 42X72 X-RAY (DRAPES) ×1 IMPLANT
DRAPE C-ARMOR (DRAPES) ×1 IMPLANT
DRAPE ORTHO SPLIT 77X108 STRL (DRAPES) ×2
DRAPE SURG ORHT 6 SPLT 77X108 (DRAPES) ×2 IMPLANT
DRAPE U-SHAPE 47X51 STRL (DRAPES) ×1 IMPLANT
DRESSING MEPILEX FLEX 4X4 (GAUZE/BANDAGES/DRESSINGS) IMPLANT
DRSG ADAPTIC 3X8 NADH LF (GAUZE/BANDAGES/DRESSINGS) ×1 IMPLANT
DRSG MEPILEX FLEX 4X4 (GAUZE/BANDAGES/DRESSINGS) ×1
DRSG MEPITEL 4X7.2 (GAUZE/BANDAGES/DRESSINGS) IMPLANT
ELECT CAUTERY BLADE 6.4 (BLADE) IMPLANT
ELECT REM PT RETURN 9FT ADLT (ELECTROSURGICAL) ×1
ELECTRODE REM PT RTRN 9FT ADLT (ELECTROSURGICAL) ×1 IMPLANT
GAUZE PAD ABD 8X10 STRL (GAUZE/BANDAGES/DRESSINGS) ×2 IMPLANT
GAUZE SPONGE 4X4 12PLY STRL (GAUZE/BANDAGES/DRESSINGS) ×1 IMPLANT
GLOVE BIO SURGEON STRL SZ 6.5 (GLOVE) ×3 IMPLANT
GLOVE BIO SURGEON STRL SZ7.5 (GLOVE) ×4 IMPLANT
GLOVE BIOGEL PI IND STRL 6.5 (GLOVE) ×1 IMPLANT
GLOVE BIOGEL PI IND STRL 7.5 (GLOVE) ×1 IMPLANT
GOWN STRL REUS W/ TWL LRG LVL3 (GOWN DISPOSABLE) ×2 IMPLANT
GOWN STRL REUS W/TWL LRG LVL3 (GOWN DISPOSABLE) ×2
HANDPIECE INTERPULSE COAX TIP (DISPOSABLE)
IMMOBILIZER KNEE 22 UNIV (SOFTGOODS) ×1 IMPLANT
KIT BASIN OR (CUSTOM PROCEDURE TRAY) ×1 IMPLANT
KIT TURNOVER KIT B (KITS) ×1 IMPLANT
MANIFOLD NEPTUNE II (INSTRUMENTS) ×1 IMPLANT
NDL SUT 6 .5 CRC .975X.05 MAYO (NEEDLE) ×1 IMPLANT
NEEDLE MAYO TAPER (NEEDLE) ×2
NS IRRIG 1000ML POUR BTL (IV SOLUTION) ×1 IMPLANT
PACK ORTHO EXTREMITY (CUSTOM PROCEDURE TRAY) ×1 IMPLANT
PACK TOTAL JOINT (CUSTOM PROCEDURE TRAY) ×1 IMPLANT
PAD ARMBOARD 7.5X6 YLW CONV (MISCELLANEOUS) ×2 IMPLANT
PAD CAST 4YDX4 CTTN HI CHSV (CAST SUPPLIES) ×1 IMPLANT
PADDING CAST COTTON 4X4 STRL (CAST SUPPLIES) ×1
PADDING CAST COTTON 6X4 STRL (CAST SUPPLIES) ×1 IMPLANT
PLATE PROX TIBIA LEFT (Plate) IMPLANT
SCREW CORTEX 3.5 45MM (Screw) IMPLANT
SCREW CORTEX 3.5 55MM (Screw) IMPLANT
SCREW LOCK 3.5X85 (Screw) IMPLANT
SCREW LOCK CORT ST 3.5X40 (Screw) IMPLANT
SCREW LOCKING 3.5X80MM VA (Screw) IMPLANT
SCREW LOCKING VA 3.5X75MM (Screw) IMPLANT
SET HNDPC FAN SPRY TIP SCT (DISPOSABLE) IMPLANT
SPONGE T-LAP 18X18 ~~LOC~~+RFID (SPONGE) ×1 IMPLANT
STAPLER VISISTAT 35W (STAPLE) ×1 IMPLANT
STOCKINETTE IMPERVIOUS 9X36 MD (GAUZE/BANDAGES/DRESSINGS) ×1 IMPLANT
SUCTION FRAZIER HANDLE 10FR (MISCELLANEOUS) ×1
SUCTION TUBE FRAZIER 10FR DISP (MISCELLANEOUS) ×1 IMPLANT
SUT ETHILON 2 0 FS 18 (SUTURE) ×1 IMPLANT
SUT ETHILON 3 0 PS 1 (SUTURE) IMPLANT
SUT ETHILON O TP 1 (SUTURE) IMPLANT
SUT FIBERWIRE #2 38 T-5 BLUE (SUTURE) ×1
SUT PDS AB 2-0 CT1 27 (SUTURE) IMPLANT
SUT VIC AB 0 CT1 27 (SUTURE) ×1
SUT VIC AB 0 CT1 27XBRD ANBCTR (SUTURE) IMPLANT
SUT VIC AB 1 CT1 18XCR BRD 8 (SUTURE) IMPLANT
SUT VIC AB 1 CT1 27 (SUTURE) ×1
SUT VIC AB 1 CT1 27XBRD ANBCTR (SUTURE) ×1 IMPLANT
SUT VIC AB 1 CT1 8-18 (SUTURE) ×1
SUT VIC AB 2-0 CT1 27 (SUTURE) ×1
SUT VIC AB 2-0 CT1 TAPERPNT 27 (SUTURE) ×2 IMPLANT
SUTURE FIBERWR #2 38 T-5 BLUE (SUTURE) IMPLANT
SWAB CULTURE ESWAB REG 1ML (MISCELLANEOUS) IMPLANT
TOWEL GREEN STERILE (TOWEL DISPOSABLE) ×2 IMPLANT
TOWEL GREEN STERILE FF (TOWEL DISPOSABLE) ×1 IMPLANT
TRAY FOLEY MTR SLVR 16FR STAT (SET/KITS/TRAYS/PACK) IMPLANT
TUBE CONNECTING 12X1/4 (SUCTIONS) ×1 IMPLANT
UNDERPAD 30X36 HEAVY ABSORB (UNDERPADS AND DIAPERS) ×1 IMPLANT
WATER STERILE IRR 1000ML POUR (IV SOLUTION) ×2 IMPLANT
YANKAUER SUCT BULB TIP NO VENT (SUCTIONS) ×1 IMPLANT

## 2023-01-07 NOTE — Op Note (Signed)
Orthopaedic Surgery Operative Note (CSN: [id number] ) Date of Surgery: 01/07/2023  Admit Date: 01/01/2023   Diagnoses: Pre-Op Diagnoses: Left bicondylar tibial plateau fracture Left lower extremity compartment syndrome  Post-Op Diagnosis: Left bicondylar tibial plateau fracture Left lower extremity compartment syndrome Left lateral posterior meniscus tear  Procedures: CPT 27536-Open reduction internal fixation of left tibial plateau fracture CPT 13160-Secondary closure of left medial fasciotomy wound CPT 27403-Repair of left lateral meniscus tear CPT 20694-Removal of left lower extremity external fixation  Surgeons : Primary: Roby Lofts, MD  Assistant: Ulyses Southward, PA-C  Location: OR 3   Anesthesia: General   Antibiotics: Ancef 2g preop with 1 gm vancomycin powder placed topically   Tourniquet time: * Missing tourniquet times found for documented tourniquets in log: [id number] * Total Tourniquet Time Documented: Thigh (Left) - 75 minutes Total: Thigh (Left) - 75 minutes  Estimated Blood Loss: 30 mL  Complications: None   Specimens:None   Implants: Implant Name Type Inv. Item Serial No. Manufacturer Lot No. LRB No. Used Action  SCREW CORTEX 3.5 - [id number] Screw SCREW CORTEX 3.5  [employer]  Left 2 Implanted  SCREW LOCK 3.5X85 - [id number] Screw SCREW LOCK 3.5X85  [employer]  Left 1 Implanted  SCREW LOCK CORT ST 3.5X40 - [id number] Screw SCREW LOCK CORT ST 3.5X40  [employer]  Left 2 Implanted  SCREW LOCKING VA 3.5X75MM - [id number] Screw SCREW LOCKING VA 3.5X75MM  [employer]  Left 1 Implanted  SCREW LOCKING 3.5X80MM VA - [id number] Screw SCREW LOCKING 3.5X80MM VA  [employer]  Left 2 Implanted  SCREW CORTEX 3.5 - [id number] Screw SCREW CORTEX 3.5  [employer]  Left 1 Implanted  PLATE PROX TIBIA LEFT - [id number] Plate PLATE PROX TIBIA LEFT  [employer]  Left 1 Implanted     Indications for  Surgery: 27 year old male who sustained a left bicondylar tibial plateau fracture with compartment syndrome.  He was taken for external fixation and 4 compartment fasciotomy release.  I took him earlier this week for wound VAC change and closure of his lateral fasciotomy wound with partial closure of his medial fasciotomy wound.  I felt that he was indicated for possible closure of his medial fasciotomy wound and possible open reduction internal fixation.  Risks and benefits were discussed with the patient.  Risks included but not limited to bleeding, infection, malunion, nonunion, hardware failure, hardware irritation, nerve or blood vessel injury, posttraumatic arthritis, knee stiffness, even the possibility anesthetic complications.  He agreed to proceed with surgery and consent was obtained.  Operative Findings: 1.  Successful closure of medial fasciotomy wound without undue tension on the skin edges. 2.  Open reduction internal fixation of left bicondylar tibial plateau fracture using Synthes VA 3.5 mm proximal tibial locking plate with single incision with percutaneous fixation of the medial condyle using 3.5 mm nonlocking screws. 3.  Peripheral lateral meniscus tear repaired with #2 FiberWire suture in a horizontal mattress fashion through the capsule. 4.  Removal of spanning knee external fixation from left lower extremity.  Procedure: The patient was identified in the preoperative holding area. Consent was confirmed with the patient and their family and all questions were answered. The operative extremity was marked after confirmation with the patient. he was then brought back to the operating room by our anesthesia colleagues.  He was placed under general anesthetic and carefully transferred over to radiolucent flattop table.  A nonsterile tourniquet was placed to his upper thigh.  A bump  was placed under his operative hip.  The left lower extremity was then prepped and draped in usual sterile  fashion.  The external fixator was prepped into the field.  A timeout was performed to verify the patient, the procedure, and the extremity.  Preoperative antibiotics were dosed.  For started out by attempting to close the medial fasciotomy wound.  The edema in the deep and superficial posterior compartments was much reduced.  As result I was able to approximate the skin edges without significant tension.  I then used far near near far suture to bring the skin edges together.  I then used interrupted 0 nylon suture to complete the closure.  The Ex-Fix bars were then removed and I changed my gloves and turned my attention to fixation of the tibial plateau.  A lateral parapatellar incision was then made and carried down through skin and subcutaneous tissue.  I incised through the IT band and expose the lateral condyle of the proximal tibia.  I developed the plane between the capsule and the IT band.  I released the IT band until I could palpate the fibular head.  I then performed a submeniscal arthrotomy and tagged the meniscus and capsule with a 0 Vicryl suture.  I then was able to visualize that there was a posterior lateral meniscus tear.  I placed a #2 FiberWire through the capsule and a horizontal mattress through the meniscus brought back out through the capsule and tagged this for later repair at the end of the case.  I then proceeded to enter the anterior lateral split and disimpact the large articular fragment of the lateral condyle.  Once I had this partially reduced I then turned my attention to the medial side.  There was a single split to the medial side with a large posterior medial fragment.  Due to the medial fasciotomy wounds I did not want to extend this into a medial approach and I felt that a single incision might be best for his fracture.  I then used a reduction tenaculum and placed a tine through the most proximal portion of the fasciotomy wound and made a percutaneous incision along the  anterior medial aspect of the plateau.  I then extended the knee and was able to clamp the posterior medial fragment anatomically to the intact anterior fragment.  I confirmed this with fluoroscopy and held provisionally with a 1.6 mm K wire.  I then percutaneously placed anterior to posterior 3.5 millimeter screws to hold this reduction.  I then returned to the lateral side to continue the reduction of the posterior lateral articular fragment.  I was able to get this reduced under lateral fluoroscopic imaging and held it provisionally with K wires.  I then reduced the lateral cortex back to the metaphysis and held this provisionally with K wires.  Once my alignment was maintained I then used a Synthes 3.5 mm VA proximal tibial locking plate and slid this submuscularly along the lateral cortex of the tibia.  I then drilled and placed a nonlocking screw proximally to bring the plate flush to bone.  I then percutaneously placed 3.5 millimeter screws in the tibial shaft to bring the distal portion of the plate flush to bone.  I then returned to the proximal segment and drilled and placed 3.5 mm locking screws to hold the articular reduction and hold the medial fragments as well.  A kickstand screw was then placed to reinforce the medial column.  1 final 3.5 millimeter screw was  placed in the tibial shaft.  Final fluoroscopic imaging was obtained all the K wires and clamps were removed.  I then used a free needle to bring the capsular tag stitches through the plate.  I tied these down and then proceeded to tie down the FiberWire suture to repair the meniscus.  The incision was copiously irrigated.  A gram of vancomycin powder was placed into the incision.  A layered closure of 0 Vicryl for the IT band and 2-0 Vicryl and 3-0 nylon was used for the skin.  The external fixator was then removed.  The external fixator sites were irrigated and closed with 3-0 nylon.  Sterile dressings were applied to the lower extremity  and the patient was then awoke from anesthesia and taken to the PACU in stable condition.  Post Op Plan/Instructions: Patient will be nonweightbearing to the left lower extremity.  He will have unrestricted range of motion of the knee.  He will receive postoperative Ancef.  He will receive Lovenox for DVT prophylaxis and discharged on aspirin 325 mg.  We will have him continue to mobilize with physical and Occupational Therapy.  I was present and performed the entire surgery.  Ulyses Southward, PA-C did assist me throughout the case. An assistant was necessary given the difficulty in approach, maintenance of reduction and ability to instrument the fracture.   Truitt Merle, MD [facility]

## 2023-01-07 NOTE — Anesthesia Postprocedure Evaluation (Signed)
Anesthesia Post Note  Patient: Arthur Ramos  Procedure(s) Performed: SECONDARY CLOSURE OF WOUND (Left) OPEN REDUCTION INTERNAL FIXATION (ORIF) TIBIAL PLATEAU (Left)     Patient location during evaluation: PACU Anesthesia Type: General Level of consciousness: awake and alert Pain management: pain level controlled Vital Signs Assessment: post-procedure vital signs reviewed and stable Respiratory status: spontaneous breathing, nonlabored ventilation, respiratory function stable and patient connected to nasal cannula oxygen Cardiovascular status: blood pressure returned to baseline and stable Postop Assessment: no apparent nausea or vomiting Anesthetic complications: no   No notable events documented.  Last Vitals:  Vitals:   01/07/23 1310 01/07/23 1331  BP: (!) 160/93 139/87  Pulse: 92 87  Resp: (!) 0 18  Temp: 36.7 C 36.8 C  SpO2: 100% 100%    Last Pain:  Vitals:   01/07/23 1331  TempSrc: Oral  PainSc: 10-Worst pain ever                 Earl Lites P Eula Mazzola

## 2023-01-07 NOTE — Anesthesia Procedure Notes (Signed)
Procedure Name: Intubation Date/Time: 01/07/2023 9:49 AM  Performed by: Gwenyth Allegra, CRNAPre-anesthesia Checklist: Patient identified, Emergency Drugs available, Suction available, Patient being monitored and Timeout performed Patient Re-evaluated:Patient Re-evaluated prior to induction Oxygen Delivery Method: Circle system utilized Preoxygenation: Pre-oxygenation with 100% oxygen Induction Type: IV induction Ventilation: Mask ventilation without difficulty Laryngoscope Size: Mac and 4 Grade View: Grade II Tube size: 7.5 mm Number of attempts: 1 Airway Equipment and Method: Stylet Placement Confirmation: ETT inserted through vocal cords under direct vision, positive ETCO2 and breath sounds checked- equal and bilateral Secured at: 22 cm Tube secured with: Tape Dental Injury: Teeth and Oropharynx as per pre-operative assessment

## 2023-01-07 NOTE — Transfer of Care (Signed)
Immediate Anesthesia Transfer of Care Note  Patient: Arthur Ramos  Procedure(s) Performed: SECONDARY CLOSURE OF WOUND (Left) OPEN REDUCTION INTERNAL FIXATION (ORIF) TIBIAL PLATEAU (Left)  Patient Location: PACU  Anesthesia Type:General  Level of Consciousness: awake, alert , oriented, patient cooperative, and responds to stimulation  Airway & Oxygen Therapy: Patient Spontanous Breathing  Post-op Assessment: Report given to RN, Post -op Vital signs reviewed and stable, and Patient moving all extremities X 4  Post vital signs: Reviewed and stable  Last Vitals:  Vitals Value Taken Time  BP Pt moving and talking, unable to get accurate BP   Temp    Pulse 109 01/07/23 1208  Resp 14 01/07/23 1208  SpO2 100 % 01/07/23 1208  Vitals shown include unvalidated device data.  Last Pain:  Vitals:   01/07/23 0854  TempSrc:   PainSc: 8       Patients Stated Pain Goal: 5 (01/07/23 0854)  Complications: No notable events documented.

## 2023-01-07 NOTE — Progress Notes (Signed)
Orthopedic Tech Progress Note Patient Details:  Arthur Ramos 1995/09/11 [id number]  Patient ID: [name], male   DOB: 09-Apr-1996, 27 y.o.   MRN: [id number] Overhead trapeze applied with demonstration of use to patient.  Ronica Vivian OTR/L 01/07/2023, 4:40 PM

## 2023-01-07 NOTE — Interval H&P Note (Signed)
History and Physical Interval Note:  01/07/2023 9:03 AM  Arthur Ramos  has presented today for surgery, with the diagnosis of Left tibial plateau fracture.  The various methods of treatment have been discussed with the patient and family. After consideration of risks, benefits and other options for treatment, the patient has consented to  Procedure(s): SECONDARY CLOSURE OF WOUND (Left) POSSIBLE OPEN REDUCTION INTERNAL FIXATION (ORIF) TIBIAL PLATEAU (Left) as a surgical intervention.  The patient's history has been reviewed, patient examined, no change in status, stable for surgery.  I have reviewed the patient's chart and labs.  Questions were answered to the patient's satisfaction.     Caryn Bee P Shanen Norris

## 2023-01-07 NOTE — Progress Notes (Signed)
PT Cancellation Note  Patient Details Name: Arthur Ramos MRN: [id number] DOB: 1996-07-13   Cancelled Treatment:    Reason Eval/Treat Not Completed: Patient at procedure or test/unavailable  Back to OR;   Will follow up later today as time allows;  Otherwise, will follow up for PT tomorrow;   Thank you,  Van Clines, PT  Acute Rehabilitation Services Office [phone]    Levi Aland 01/07/2023, 11:06 AM

## 2023-01-07 NOTE — Anesthesia Preprocedure Evaluation (Signed)
Anesthesia Evaluation  Patient identified by MRN, date of birth, ID band Patient awake    Reviewed: Allergy & Precautions, NPO status , Patient's Chart, lab work & pertinent test results  Airway Mallampati: II  TM Distance: >3 FB Neck ROM: Full    Dental no notable dental hx.    Pulmonary neg pulmonary ROS, Current Smoker and Patient abstained from smoking.   Pulmonary exam normal        Cardiovascular negative cardio ROS  Rhythm:Regular Rate:Normal     Neuro/Psych   Anxiety  Bipolar Disorder      GI/Hepatic negative GI ROS, Neg liver ROS,,,  Endo/Other  negative endocrine ROS    Renal/GU negative Renal ROS  negative genitourinary   Musculoskeletal negative musculoskeletal ROS (+)  Left tibial plateau fx    Abdominal Normal abdominal exam  (+)   Peds  Hematology negative hematology ROS (+)   Anesthesia Other Findings   Reproductive/Obstetrics                             Anesthesia Physical Anesthesia Plan  ASA: 2  Anesthesia Plan: General   Post-op Pain Management:    Induction: Intravenous  PONV Risk Score and Plan: 1 and Ondansetron, Dexamethasone, Midazolam and Treatment may vary due to age or medical condition  Airway Management Planned: Mask and Oral ETT  Additional Equipment: None  Intra-op Plan:   Post-operative Plan: Extubation in OR  Informed Consent: I have reviewed the patients History and Physical, chart, labs and discussed the procedure including the risks, benefits and alternatives for the proposed anesthesia with the patient or authorized representative who has indicated his/her understanding and acceptance.     Dental advisory given  Plan Discussed with: CRNA  Anesthesia Plan Comments:        Anesthesia Quick Evaluation

## 2023-01-07 NOTE — H&P (View-Only) (Signed)
Ortho Trauma Note  We will plan to proceed to the operating room today for possible medial fasciotomy closure with possible open reduction internal fixation.  Discussed risk and benefits with the patient.  He agrees to proceed with surgery and consent was obtained.  Depending on ability to close the fasciotomy and fix the plateau we will determine further hospitalization stay and further surgeries as needed.  Patient under stands this and agrees to proceed.  Miyonna Ormiston P. Jonavin Seder, MD [facility] [phone] (office) [email or web address]  

## 2023-01-07 NOTE — Progress Notes (Signed)
Ortho Trauma Note  We will plan to proceed to the operating room today for possible medial fasciotomy closure with possible open reduction internal fixation.  Discussed risk and benefits with the patient.  He agrees to proceed with surgery and consent was obtained.  Depending on ability to close the fasciotomy and fix the plateau we will determine further hospitalization stay and further surgeries as needed.  Patient under stands this and agrees to proceed.  Roby Lofts, MD [facility] [phone] (office) [email or web address]

## 2023-01-08 LAB — BASIC METABOLIC PANEL
Anion gap: 9 (ref 5–15)
BUN: 7 mg/dL (ref 6–20)
CO2: 25 mmol/L (ref 22–32)
Calcium: 8.2 mg/dL — ABNORMAL LOW (ref 8.9–10.3)
Chloride: 99 mmol/L (ref 98–111)
Creatinine, Ser: 0.9 mg/dL (ref 0.61–1.24)
GFR, Estimated: >60 mL/min (ref >60)
Glucose, Bld: 123 mg/dL — ABNORMAL HIGH (ref 70–99)
Potassium: 3.6 mmol/L (ref 3.5–5.1)
Sodium: 133 mmol/L — ABNORMAL LOW (ref 135–145)

## 2023-01-08 LAB — CBC
HCT: 19.8 % — ABNORMAL LOW (ref 39.0–52.0)
Hemoglobin: 6.5 g/dL — CL (ref 13.0–17.0)
MCH: 28.3 pg (ref 26.0–34.0)
MCHC: 32.8 g/dL (ref 30.0–36.0)
MCV: 86.1 fL (ref 80.0–100.0)
Platelets: 343 10*3/uL (ref 150–400)
RBC: 2.3 MIL/uL — ABNORMAL LOW (ref 4.22–5.81)
RDW: 14.3 % (ref 11.5–15.5)
WBC: 14.4 10*3/uL — ABNORMAL HIGH (ref 4.0–10.5)
nRBC: 0 % (ref 0.0–0.2)

## 2023-01-08 MED ORDER — ENOXAPARIN SODIUM 40 MG/0.4ML IJ SOSY
40.0000 mg | PREFILLED_SYRINGE | INTRAMUSCULAR | Status: DC
  Administered 2023-01-09: 40 mg via SUBCUTANEOUS
  Filled 2023-01-08: qty 0.4

## 2023-01-08 MED ORDER — ENOXAPARIN SODIUM 30 MG/0.3ML IJ SOSY
30.0000 mg | PREFILLED_SYRINGE | Freq: Two times a day (BID) | INTRAMUSCULAR | Status: DC
Start: 2023-01-08 — End: 2023-01-08

## 2023-01-08 MED ORDER — FERROUS SULFATE 325 (65 FE) MG PO TABS
325.0000 mg | ORAL_TABLET | Freq: Three times a day (TID) | ORAL | Status: DC
  Administered 2023-01-08 – 2023-01-10 (×7): 325 mg via ORAL
  Filled 2023-01-08 (×7): qty 1

## 2023-01-08 MED ORDER — KETOROLAC TROMETHAMINE 15 MG/ML IJ SOLN
15.0000 mg | Freq: Four times a day (QID) | INTRAMUSCULAR | Status: DC
  Administered 2023-01-08 – 2023-01-10 (×9): 15 mg via INTRAVENOUS
  Filled 2023-01-08 (×9): qty 1
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
## 2023-01-08 NOTE — Progress Notes (Addendum)
Lab called about critical result for this Pt. Latest hemoglobin is 6.5. No bleeding noted.

## 2023-01-08 NOTE — Progress Notes (Signed)
PT Cancellation Note  Patient Details Name: Arthur Ramos MRN: [id number] DOB: [birth year]   Cancelled Treatment:    Reason Eval/Treat Not Completed: Other (comment).  Declined to get up to walk and PT requested meds for pain to reattempt.     Ivar Drape 01/08/2023, 11:52 AM  Samul Dada, PT PhD Acute Rehab Dept. Number: [facility] [place] [id number] and [facility] [phone]
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
## 2023-01-08 NOTE — Progress Notes (Signed)
Date and time results received: 01/08/23 6:55 AM  (use smartphrase ".now" to insert current time)  Test: Hemoglobin Critical Value: 6.5  Name of Provider Notified: Truitt Merle MD  Orders Received? Or Actions Taken?:

## 2023-01-08 NOTE — TOC CM/SW Note (Signed)
  Transition of Care [facility]) Screening Note   Patient Details  Name: Arthur Ramos Date of Birth: 1996/06/02   Await updated post op PT evaluations.   NWB LLE  Prior recommendations OP PT , walker vs crutches    Transition of Care Department (TOC) will continue to monitor patient advancement through interdisciplinary progression rounds. If new patient transition needs arise, please place a TOC consult.

## 2023-01-08 NOTE — Progress Notes (Signed)
Orthopaedic Trauma Progress Note  SUBJECTIVE: Patient doing okay this morning, resting comfortably in bed.  States pain medications are helping some, the IV medications helping more than the oral meds.  He has been able to get up and move around some since surgery with use of his walker. Denies any significant numbness or tingling throughout the left lower extremity.  Hemoglobin noted to be 6.5 on morning labs.  Patient remains asymptomatic.  He denies any nausea, vomiting, lightheadedness, dizziness.  No chest pain.  No shortness of breath.    OBJECTIVE:  Vitals:   01/08/23 0044 01/08/23 0438  BP: (!) 135/90 133/77  Pulse: (!) 108 (!) 102  Resp: 18 17  Temp: 99.2 F (37.3 C) 98.1 F (36.7 C)  SpO2: 100% 100%    General: Resting in bed comfortably, no acute distress respiratory: No increased work of breathing.  Left lower extremity: Dressings clean, dry, intact.  Has some swelling throughout the calf but compartments compressible.  Nontender about the knee as expected.  Ankle dorsiflexion/plantarflexion intact.  No increase in pain with passive motion of the toes.  Able to actively wiggle the toes.  Endorses sensation over all aspects of the foot. + DP pulse  IMAGING: Stable postop imaging  LABS:  Results for orders placed or performed during the hospital encounter of 01/01/23 (from the past 24 hour(s))  CBC     Status: Abnormal   Collection Time: 01/08/23  6:05 AM  Result Value Ref Range   WBC 14.4 (H) 4.0 - 10.5 K/uL   RBC 2.30 (L) 4.22 - 5.81 MIL/uL   Hemoglobin 6.5 (LL) 13.0 - 17.0 g/dL   HCT 16.1 (L) 09.6 - 04.5 %   MCV 86.1 80.0 - 100.0 fL   MCH 28.3 26.0 - 34.0 pg   MCHC 32.8 30.0 - 36.0 g/dL   RDW 40.9 81.1 - 91.4 %   Platelets 343 150 - 400 K/uL   nRBC 0.0 0.0 - 0.2 %  Basic metabolic panel     Status: Abnormal   Collection Time: 01/08/23  6:05 AM  Result Value Ref Range   Sodium 133 (L) 135 - 145 mmol/L   Potassium 3.6 3.5 - 5.1 mmol/L   Chloride 99 98 - 111 mmol/L    CO2 25 22 - 32 mmol/L   Glucose, Bld 123 (H) 70 - 99 mg/dL   BUN 7 6 - 20 mg/dL   Creatinine, Ser 7.82 0.61 - 1.24 mg/dL   Calcium 8.2 (L) 8.9 - 10.3 mg/dL   GFR, Estimated >95 >62 mL/min   Anion gap 9 5 - 15    ASSESSMENT: Arthur Ramos is a 27 y.o. male s/p LEG FASCIOTOMY, EXTERNAL FIXATION APPLICATION, CLOSED REDUCTION LEFT TIBIAL PLATEAU FRACTURE by Dr. Blanchie Dessert 01/01/2023 APPLICATION OF WOUND VAC X2  by Dr. Blanchie Dessert 01/01/2023 WOUND VAC CHANGE AND SECONDARY CLOSURE OF LATERAL FASCIOTOMY by Dr. Jena Gauss 01/05/2023 REMOVAL OF EXTERNAL FIXATION WITH ORIF LEFT TIBIAL PLATEAU by Dr. Jena Gauss 01/07/2023 SECONDARY WOUND CLOSURE OF MEDIAL FASCIOTOMY by Dr. Jena Gauss 01/07/2023  CV/Blood loss: Acute blood loss anemia, Hgb 6.5 this morning.  Slightly tachycardic, likely related to pain.    PLAN: Weightbearing: NWB LLE ROM: Okay for unrestricted hip, knee, ankle ROM Incisional and dressing care: Reinforce dressings as needed  Showering: Okay to begin getting incisions wet 01/11/2023 Orthopedic device(s): None Pain management:  1. Tylenol 1000 mg q 6 hours scheduled 2. Robaxin 500 mg q 6 hours PRN 3. Oxycodone 5-15 mg q 4 hours PRN  4. Dilaudid 0.5-1 mg q 4 hours PRN 6. Toradol 15 mg q 6 hours x 5 days VTE prophylaxis: Hold Lovenox this morning. SCDs ID:  Ancef 2gm post op Foley/Lines:  No foley, KVO IVFs Impediments to Fracture Healing: Vit D level 6, started on supplementation Dispo: Hemoglobin 6.5 this morning, patient remains asymptomatic.  Will hold off on blood transfusion.  If patient does become symptomatic, will consider transfusion later today if needed.  Patient agreed with the plan.  PT/OT evaluation ongoing.  Continue to work on pain control, trying to limit IV narcotics as able.  Follow - up plan: 2 weeks after discharge for wound check and repeat x-rays   Contact information:  Truitt Merle MD, Thyra Breed PA-C. After hours and holidays please check [email or web address] for group call  information for [facility]   Thompson Caul, PA-C [phone] (office) [email or web address]

## 2023-01-09 LAB — CBC
HCT: 22 % — ABNORMAL LOW (ref 39.0–52.0)
Hemoglobin: 7 g/dL — ABNORMAL LOW (ref 13.0–17.0)
MCH: 28.2 pg (ref 26.0–34.0)
MCHC: 31.8 g/dL (ref 30.0–36.0)
MCV: 88.7 fL (ref 80.0–100.0)
Platelets: 473 10*3/uL — ABNORMAL HIGH (ref 150–400)
RBC: 2.48 MIL/uL — ABNORMAL LOW (ref 4.22–5.81)
RDW: 14.7 % (ref 11.5–15.5)
WBC: 15.5 10*3/uL — ABNORMAL HIGH (ref 4.0–10.5)
nRBC: 0 % (ref 0.0–0.2)

## 2023-01-09 MED ORDER — FERROUS SULFATE 325 (65 FE) MG PO TABS: 325.0000 mg | ORAL_TABLET | Freq: Every day | ORAL | 0 refills | Status: AC

## 2023-01-09 MED ORDER — METHOCARBAMOL 500 MG PO TABS: 500.0000 mg | ORAL_TABLET | Freq: Four times a day (QID) | ORAL | 0 refills | Status: AC | PRN

## 2023-01-09 MED ORDER — OXYCODONE HCL 10 MG PO TABS: 10.0000 mg | ORAL_TABLET | ORAL | 0 refills | Status: AC | PRN

## 2023-01-09 MED ORDER — ACETAMINOPHEN 500 MG PO TABS: 1000.0000 mg | ORAL_TABLET | Freq: Four times a day (QID) | ORAL | 0 refills | Status: DC | PRN

## 2023-01-09 MED ORDER — VITAMIN D (ERGOCALCIFEROL) 1.25 MG (50000 UNIT) PO CAPS: 50000.0000 [IU] | ORAL_CAPSULE | ORAL | 0 refills | Status: AC
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
## 2023-01-09 NOTE — Discharge Instructions (Signed)
Orthopaedic Trauma Service Discharge Instructions   General Discharge Instructions  WEIGHT BEARING STATUS:Non-weightbearing left lower extremity  RANGE OF MOTION/ACTIVITY: ok for knee motion as tolerated  Wound Care: You may remove your surgical dressing on Saturday 01/10/23. Incisions can be left open to air if there is no drainage. Once the incision is completely dry and without drainage, it may be left open to air out.  Showering may begin Sunday 01/11/23.  Clean incision gently with soap and water.  DVT/PE prophylaxis: Aspirin 325 mg daily x 30 days  Diet: as you were eating previously.  Can use over the counter stool softeners and bowel preparations, such as Miralax, to help with bowel movements.  Narcotics can be constipating.  Be sure to drink plenty of fluids  PAIN MEDICATION USE AND EXPECTATIONS  You have likely been given narcotic medications to help control your pain.  After a traumatic event that results in an fracture (broken bone) with or without surgery, it is ok to use narcotic pain medications to help control one's pain.  We understand that everyone responds to pain differently and each individual patient will be evaluated on a regular basis for the continued need for narcotic medications. Ideally, narcotic medication use should last no more than 6-8 weeks (coinciding with fracture healing).   As a patient it is your responsibility as well to monitor narcotic medication use and report the amount and frequency you use these medications when you come to your office visit.   We would also advise that if you are using narcotic medications, you should take a dose prior to therapy to maximize you participation.  IF YOU ARE ON NARCOTIC MEDICATIONS IT IS NOT PERMISSIBLE TO OPERATE A MOTOR VEHICLE (MOTORCYCLE/CAR/TRUCK/MOPED) OR HEAVY MACHINERY DO NOT MIX NARCOTICS WITH OTHER CNS (CENTRAL NERVOUS SYSTEM) DEPRESSANTS SUCH AS ALCOHOL   STOP SMOKING OR USING NICOTINE PRODUCTS!!!!  As  discussed nicotine severely impairs your body's ability to heal surgical and traumatic wounds but also impairs bone healing.  Wounds and bone heal by forming microscopic blood vessels (angiogenesis) and nicotine is a vasoconstrictor (essentially, shrinks blood vessels).  Therefore, if vasoconstriction occurs to these microscopic blood vessels they essentially disappear and are unable to deliver necessary nutrients to the healing tissue.  This is one modifiable factor that you can do to dramatically increase your chances of healing your injury.    (This means no smoking, no nicotine gum, patches, etc)  DO NOT USE NONSTEROIDAL ANTI-INFLAMMATORY DRUGS (NSAID'S)  Using products such as Advil (ibuprofen), Aleve (naproxen), Motrin (ibuprofen) for additional pain control during fracture healing can delay and/or prevent the healing response.  If you would like to take over the counter (OTC) medication, Tylenol (acetaminophen) is ok.  However, some narcotic medications that are given for pain control contain acetaminophen as well. Therefore, you should not exceed more than 4000 mg of tylenol in a day if you do not have liver disease.  Also note that there are may OTC medicines, such as cold medicines and allergy medicines that my contain tylenol as well.  If you have any questions about medications and/or interactions please ask your doctor/PA or your pharmacist.      ICE AND ELEVATE INJURED/OPERATIVE EXTREMITY  Using ice and elevating the injured extremity above your heart can help with swelling and pain control.  Icing in a pulsatile fashion, such as 20 minutes on and 20 minutes off, can be followed.    Do not place ice directly on skin. Make sure  there is a barrier between to skin and the ice pack.    Using frozen items such as frozen peas works well as the conform nicely to the are that needs to be iced.  USE AN ACE WRAP OR TED HOSE FOR SWELLING CONTROL  In addition to icing and elevation, Ace wraps or TED  hose are used to help limit and resolve swelling.  It is recommended to use Ace wraps or TED hose until you are informed to stop.    When using Ace Wraps start the wrapping distally (farthest away from the body) and wrap proximally (closer to the body)   Example: If you had surgery on your leg or thing and you do not have a splint on, start the ace wrap at the toes and work your way up to the thigh        If you had surgery on your upper extremity and do not have a splint on, start the ace wrap at your fingers and work your way up to the upper arm   CALL THE OFFICE FOR MEDICATION REFILLS OR WITH ANY QUESTIONS/CONCERNS: [phone]   VISIT OUR WEBSITE FOR ADDITIONAL INFORMATION: [email or web address]    Discharge Wound Care Instructions  Do NOT apply any ointments, solutions or lotions to pin sites or surgical wounds.  These prevent needed drainage and even though solutions like hydrogen peroxide kill bacteria, they also damage cells lining the pin sites that help fight infection.  Applying lotions or ointments can keep the wounds moist and can cause them to breakdown and open up as well. This can increase the risk for infection. When in doubt call the office.  Surgical incisions should be dressed daily.  If any drainage is noted, use one layer of adaptic or Mepitel, then gauze, Kerlix, and an ace wrap. - These dressing supplies should be available at local medical supply stores [facility], [facility], etc) as well as Insurance claims handler ([facility], [facility], [place], etc)  Once the incision is completely dry and without drainage, it may be left open to air out.  Showering may begin 36-48 hours later.  Cleaning gently with soap and water.  Traumatic wounds should be dressed daily as well.    One layer of adaptic, gauze, Kerlix, then ace wrap.  The adaptic can be discontinued once the draining has ceased    If you have a wet to dry dressing: wet the gauze with saline the squeeze as much saline  out so the gauze is moist (not soaking wet), place moistened gauze over wound, then place a dry gauze over the moist one, followed by Kerlix wrap, then ace wrap.
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
## 2023-01-09 NOTE — TOC Initial Note (Addendum)
Transition of Care (TOC) - Initial/Assessment Note   Spoke to patient at bedside.   Confirmed face sheet information.   Patient from home alone. He has friends / family to stay with him at discharge. However they work weekdays but will be there in evenings and nights.    Ordered walker with [place]  with [employer] . Patient declined tub / shower bench   Discussed OP PT , patient states he does have transportation . Secure chatted PA to see when she would like him to start OP PT .   Financial counselor consent . Patient was just hired full time and unsure if he has insurance or not  Patient does not have PCP  TOC CMA to schedule follow up appointment and place on AVS   Changed pharmacy to [facility]   Patient aware anticipated discharge date is 01/10/23   Changed pharmacy to [facility] with [employer] called cost of walker is $42.95. NCM went to discuss with patient. Prior to NCM telling patient cost of walker he is asking for crutches instead. Secure chatted PA and PT   Per PA : hold off on outpatient therapy until he starts putting weight on the leg . Crutches are fine. She will place work note on chart .  Bedside nurse orders crutches. Cancelled walker  Patient Details  Name: Arthur Ramos MRN: [id number] Date of Birth: 04/17/1996  Transition of Care [facility]) CM/SW Contact:    Kingsley Plan, RN Phone Number: 01/09/2023, 10:21 AM  Clinical Narrative:                   Expected Discharge Plan: Home/Self Care Barriers to Discharge: Continued Medical Work up   Patient Goals and CMS Choice Patient states their goals for this hospitalization and ongoing recovery are:: to return to home     [facility] ownership interest in [facility]provided to:: Patient    Expected Discharge Plan and Services In-house Referral: Artist, PCP / [facility] Discharge Planning Services: CM Consult   Living arrangements for the past 2 months:  Apartment                 DME Arranged: Walker rolling DME Agency: [employer] Date DME Agency Contacted: 01/09/23 Time DME Agency Contacted: [address]  HH Arranged: NA          Prior Living Arrangements/Services Living arrangements for the past 2 months: Apartment Lives with:: Self Patient language and need for interpreter reviewed:: Yes Do you feel safe going back to the place where you live?: Yes      Need for Family Participation in Patient Care: Yes (Comment) Care giver support system in place?: Yes (comment)   Criminal Activity/Legal Involvement Pertinent to Current Situation/Hospitalization: No - Comment as needed  Activities of Daily Living Home Assistive Devices/Equipment: None ADL Screening (condition at time of admission) Patient's cognitive ability adequate to safely complete daily activities?: Yes Is the patient deaf or have difficulty hearing?: No Does the patient have difficulty seeing, even when wearing glasses/contacts?: No Does the patient have difficulty concentrating, remembering, or making decisions?: No Patient able to express need for assistance with ADLs?: No Does the patient have difficulty dressing or bathing?: No Independently performs ADLs?: Yes (appropriate for developmental age) Does the patient have difficulty walking or climbing stairs?: No Weakness of Legs: Left Weakness of Arms/Hands: None  Permission Sought/Granted   Permission granted to share information with : No  Emotional Assessment Appearance:: Appears stated age Attitude/Demeanor/Rapport: Engaged Affect (typically observed): Accepting Orientation: : Oriented to Self, Oriented to Place, Oriented to  Time, Oriented to Situation Alcohol / Substance Use: Not Applicable Psych Involvement: No (comment)  Admission diagnosis:  Closed fracture of left tibial plateau, initial encounter [S82.142A] Closed fracture of proximal end of left fibula, unspecified fracture  morphology, initial encounter [S82.832A] Compartment syndrome of lower leg (HCC) [T79.A29A] Patient Active Problem List   Diagnosis Date Noted   Compartment syndrome of lower leg (HCC) 01/01/2023   Closed fracture of medial malleolus 09/05/2019   PCP:  Patient, No Pcp Per Pharmacy:   [id number] - [place], [place] - [address] AT [address] [place] [address] Phone: [phone] Fax: [phone]     Social Determinants of Health (SDOH) Social History: SDOH Screenings   Food Insecurity: No Food Insecurity (01/02/2023)  Housing: Low Risk  (01/02/2023)  Transportation Needs: No Transportation Needs (01/02/2023)  Utilities: Not At Risk (01/02/2023)  Tobacco Use: High Risk (01/07/2023)   SDOH Interventions:     Readmission Risk Interventions     No data to display

## 2023-01-09 NOTE — Progress Notes (Signed)
Received call from lab to inform that blood draw was insufficient. Call lab tech for 6N to request a redraw, remains pending at this time.

## 2023-01-09 NOTE — Plan of Care (Signed)

## 2023-01-10 LAB — BASIC METABOLIC PANEL
Anion gap: 9 (ref 5–15)
BUN: 8 mg/dL (ref 6–20)
CO2: 24 mmol/L (ref 22–32)
Calcium: 8.6 mg/dL — ABNORMAL LOW (ref 8.9–10.3)
Chloride: 101 mmol/L (ref 98–111)
Creatinine, Ser: 1.02 mg/dL (ref 0.61–1.24)
GFR, Estimated: >60 mL/min (ref >60)
Glucose, Bld: 108 mg/dL — ABNORMAL HIGH (ref 70–99)
Potassium: 3.8 mmol/L (ref 3.5–5.1)
Sodium: 134 mmol/L — ABNORMAL LOW (ref 135–145)

## 2023-01-10 LAB — CBC
HCT: 22 % — ABNORMAL LOW (ref 39.0–52.0)
Hemoglobin: 7 g/dL — ABNORMAL LOW (ref 13.0–17.0)
MCH: 28.3 pg (ref 26.0–34.0)
MCHC: 31.8 g/dL (ref 30.0–36.0)
MCV: 89.1 fL (ref 80.0–100.0)
Platelets: 518 10*3/uL — ABNORMAL HIGH (ref 150–400)
RBC: 2.47 MIL/uL — ABNORMAL LOW (ref 4.22–5.81)
RDW: 14.7 % (ref 11.5–15.5)
WBC: 14.9 10*3/uL — ABNORMAL HIGH (ref 4.0–10.5)
nRBC: 0 % (ref 0.0–0.2)

## 2023-01-10 NOTE — Progress Notes (Signed)
Orthopedic Tech Progress Note Patient Details:  Arthur Ramos August 10, 1996 [id number]  Ortho Devices Type of Ortho Device: Crutches Ortho Device/Splint Interventions: Ordered, Adjustment   Post Interventions Patient Tolerated: Ambulated well Instructions Provided: Adjustment of device  Fia Hebert E [name] 01/10/2023, 10:37 AM

## 2023-01-10 NOTE — Progress Notes (Signed)
Orthopaedic Progress Note  SUBJECTIVE: Doing well this AM. Eating, sitting comfortably. Pain manageable. Denies any numbness/tingling. Recently received crutches. He denies any nausea, vomiting, lightheadedness, dizziness.  No chest pain or shortness of breath.  Hgb 7.0 today, appears stable.  No other complaints at this time, would like to go home today.  OBJECTIVE:  Vitals:   01/10/23 0621 01/10/23 0750  BP: 138/83 133/84  Pulse: (!) 105 (!) 103  Resp: 19 18  Temp: 98.3 F (36.8 C) 98 F (36.7 C)  SpO2: 100% 100%    General: Resting in bed comfortably, no acute distress respiratory: No increased work of breathing.  Left lower extremity: Ccompartments soft.  Mildly tender about the knee as expected.  Ankle dorsiflexion/plantarflexion intact.  No increase in pain with passive motion of the toes.  Able to actively wiggle the toes.  Endorses sensation over all aspects of the foot. + DP pulse  IMAGING: Stable postop imaging  LABS:  Results for orders placed or performed during the hospital encounter of 01/01/23 (from the past 24 hour(s))  CBC     Status: Abnormal   Collection Time: 01/09/23 11:33 AM  Result Value Ref Range   WBC 15.5 (H) 4.0 - 10.5 K/uL   RBC 2.48 (L) 4.22 - 5.81 MIL/uL   Hemoglobin 7.0 (L) 13.0 - 17.0 g/dL   HCT 16.1 (L) 09.6 - 04.5 %   MCV 88.7 80.0 - 100.0 fL   MCH 28.2 26.0 - 34.0 pg   MCHC 31.8 30.0 - 36.0 g/dL   RDW 40.9 81.1 - 91.4 %   Platelets 473 (H) 150 - 400 K/uL   nRBC 0.0 0.0 - 0.2 %  Basic metabolic panel     Status: Abnormal   Collection Time: 01/10/23 12:14 AM  Result Value Ref Range   Sodium 134 (L) 135 - 145 mmol/L   Potassium 3.8 3.5 - 5.1 mmol/L   Chloride 101 98 - 111 mmol/L   CO2 24 22 - 32 mmol/L   Glucose, Bld 108 (H) 70 - 99 mg/dL   BUN 8 6 - 20 mg/dL   Creatinine, Ser 7.82 0.61 - 1.24 mg/dL   Calcium 8.6 (L) 8.9 - 10.3 mg/dL   GFR, Estimated >95 >62 mL/min   Anion gap 9 5 - 15  CBC     Status: Abnormal   Collection Time:  01/10/23 12:14 AM  Result Value Ref Range   WBC 14.9 (H) 4.0 - 10.5 K/uL   RBC 2.47 (L) 4.22 - 5.81 MIL/uL   Hemoglobin 7.0 (L) 13.0 - 17.0 g/dL   HCT 13.0 (L) 86.5 - 78.4 %   MCV 89.1 80.0 - 100.0 fL   MCH 28.3 26.0 - 34.0 pg   MCHC 31.8 30.0 - 36.0 g/dL   RDW 69.6 29.5 - 28.4 %   Platelets 518 (H) 150 - 400 K/uL   nRBC 0.0 0.0 - 0.2 %     ASSESSMENT: Arthur Ramos is a 27 y.o. male s/p LEG FASCIOTOMY, EXTERNAL FIXATION APPLICATION, CLOSED REDUCTION LEFT TIBIAL PLATEAU FRACTURE by Dr. Blanchie Dessert 01/01/2023 APPLICATION OF WOUND VAC X2  by Dr. Blanchie Dessert 01/01/2023 WOUND VAC CHANGE AND SECONDARY CLOSURE OF LATERAL FASCIOTOMY by Dr. Jena Gauss 01/05/2023 REMOVAL OF EXTERNAL FIXATION WITH ORIF LEFT TIBIAL PLATEAU by Dr. Jena Gauss 01/07/2023 SECONDARY WOUND CLOSURE OF MEDIAL FASCIOTOMY by Dr. Jena Gauss 01/07/2023  CV/Blood loss: Acute blood loss anemia, Hgb 6.5 on 01/08/23, CBC pending this AM  PLAN: Weightbearing: NWB LLE ROM: Okay for unrestricted hip, knee,  ankle ROM Incisional and dressing care: Change PRN Showering: Okay to begin getting incisions wet 01/11/2023 Orthopedic device(s): None Pain management:  1. Tylenol 1000 mg q 6 hours scheduled 2. Robaxin 500 mg q 6 hours PRN 3. Oxycodone 5-15 mg q 4 hours PRN 4. Dilaudid 0.5-1 mg q 4 hours PRN 6. Toradol 15 mg q 6 hours x 5 days VTE prophylaxis: Hold Lovenox until Hgb stabilizes. SCDs ID:  Ancef 2gm post op Foley/Lines:  No foley, KVO IVFs Impediments to Fracture Healing: Vit D level 6, started on supplementation Dispo:  PT/OT evaluation ongoing.  Continue to work on pain control, trying to limit IV narcotics as able. Hgb stable, plan for DC home today with outpatient follow up as planned  Follow - up plan: 2 weeks after discharge for wound check and repeat x-rays   Contact information:  Truitt Merle MD, Thyra Breed PA-C. After hours and holidays please check [email or web address] for group call information for [facility]   Thompson Caul, PA-C [phone] (office) [email or web address]

## 2023-01-10 NOTE — Progress Notes (Signed)
Pt declined the RW and he prefers to use crutches. RN contacted the ortho tech for the crutches.

## 2023-01-11 ENCOUNTER — Emergency Department (HOSPITAL_COMMUNITY): Payer: Self-pay

## 2023-01-11 ENCOUNTER — Other Ambulatory Visit: Payer: Self-pay

## 2023-01-11 ENCOUNTER — Encounter (HOSPITAL_COMMUNITY): Payer: Self-pay

## 2023-01-11 ENCOUNTER — Inpatient Hospital Stay (HOSPITAL_COMMUNITY)
Admission: EM | Admit: 2023-01-11 | Discharge: 2023-01-13 | DRG: 948 | Disposition: A | Discharge status: Home / Self Care | Payer: Self-pay | Attending: Student | Admitting: Student

## 2023-01-11 DIAGNOSIS — W19XXXA Unspecified fall, initial encounter: Secondary | ICD-10-CM | POA: Diagnosis present

## 2023-01-11 DIAGNOSIS — D62 Acute posthemorrhagic anemia: Secondary | ICD-10-CM | POA: Diagnosis present

## 2023-01-11 DIAGNOSIS — F17200 Nicotine dependence, unspecified, uncomplicated: Secondary | ICD-10-CM | POA: Diagnosis present

## 2023-01-11 DIAGNOSIS — F17290 Nicotine dependence, other tobacco product, uncomplicated: Secondary | ICD-10-CM | POA: Diagnosis present

## 2023-01-11 DIAGNOSIS — Z9181 History of falling: Secondary | ICD-10-CM | POA: Diagnosis not present

## 2023-01-11 DIAGNOSIS — S82142D Displaced bicondylar fracture of left tibia, subsequent encounter for closed fracture with routine healing: Secondary | ICD-10-CM

## 2023-01-11 DIAGNOSIS — F319 Bipolar disorder, unspecified: Secondary | ICD-10-CM | POA: Diagnosis present

## 2023-01-11 DIAGNOSIS — Z602 Problems related to living alone: Secondary | ICD-10-CM | POA: Diagnosis present

## 2023-01-11 DIAGNOSIS — M79606 Pain in leg, unspecified: Secondary | ICD-10-CM | POA: Diagnosis present

## 2023-01-11 DIAGNOSIS — M79605 Pain in left leg: Principal | ICD-10-CM

## 2023-01-11 DIAGNOSIS — G8918 Other acute postprocedural pain: Principal | ICD-10-CM | POA: Diagnosis present

## 2023-01-11 LAB — CBC WITH DIFFERENTIAL/PLATELET
Abs Immature Granulocytes: 0.3 10*3/uL — ABNORMAL HIGH (ref 0.00–0.07)
Basophils Absolute: 0.1 10*3/uL (ref 0.0–0.1)
Basophils Relative: 1 %
Eosinophils Absolute: 0.1 10*3/uL (ref 0.0–0.5)
Eosinophils Relative: 1 %
HCT: 22.4 % — ABNORMAL LOW (ref 39.0–52.0)
Hemoglobin: 7 g/dL — ABNORMAL LOW (ref 13.0–17.0)
Immature Granulocytes: 2 %
Lymphocytes Relative: 18 %
Lymphs Abs: 2.6 10*3/uL (ref 0.7–4.0)
MCH: 27.6 pg (ref 26.0–34.0)
MCHC: 31.3 g/dL (ref 30.0–36.0)
MCV: 88.2 fL (ref 80.0–100.0)
Monocytes Absolute: 1 10*3/uL (ref 0.1–1.0)
Monocytes Relative: 7 %
Neutro Abs: 10.5 10*3/uL — ABNORMAL HIGH (ref 1.7–7.7)
Neutrophils Relative %: 71 %
Platelets: 627 10*3/uL — ABNORMAL HIGH (ref 150–400)
RBC: 2.54 MIL/uL — ABNORMAL LOW (ref 4.22–5.81)
RDW: 14.6 % (ref 11.5–15.5)
WBC: 14.6 10*3/uL — ABNORMAL HIGH (ref 4.0–10.5)
nRBC: 0 % (ref 0.0–0.2)

## 2023-01-11 LAB — BASIC METABOLIC PANEL
Anion gap: 11 (ref 5–15)
BUN: 9 mg/dL (ref 6–20)
CO2: 23 mmol/L (ref 22–32)
Calcium: 8.4 mg/dL — ABNORMAL LOW (ref 8.9–10.3)
Chloride: 102 mmol/L (ref 98–111)
Creatinine, Ser: 0.94 mg/dL (ref 0.61–1.24)
GFR, Estimated: >60 mL/min (ref >60)
Glucose, Bld: 110 mg/dL — ABNORMAL HIGH (ref 70–99)
Potassium: 3.7 mmol/L (ref 3.5–5.1)
Sodium: 136 mmol/L (ref 135–145)

## 2023-01-11 MED ORDER — PANTOPRAZOLE SODIUM 40 MG PO TBEC
40.0000 mg | DELAYED_RELEASE_TABLET | Freq: Every day | ORAL | Status: DC
  Administered 2023-01-11 – 2023-01-13 (×2): 40 mg via ORAL
  Filled 2023-01-11 (×3): qty 1

## 2023-01-11 MED ORDER — HYDROCODONE-ACETAMINOPHEN 5-325 MG PO TABS
2.0000 | ORAL_TABLET | Freq: Once | ORAL | Status: AC
  Administered 2023-01-11: 2 via ORAL
  Filled 2023-01-11: qty 2

## 2023-01-11 MED ORDER — METHOCARBAMOL 500 MG PO TABS
500.0000 mg | ORAL_TABLET | Freq: Four times a day (QID) | ORAL | Status: DC | PRN
  Administered 2023-01-11 – 2023-01-13 (×4): 500 mg via ORAL
  Filled 2023-01-11 (×5): qty 1

## 2023-01-11 MED ORDER — ACETAMINOPHEN 500 MG PO TABS
1000.0000 mg | ORAL_TABLET | Freq: Once | ORAL | Status: AC
  Administered 2023-01-11: 1000 mg via ORAL
  Filled 2023-01-11: qty 2

## 2023-01-11 MED ORDER — ZOLPIDEM TARTRATE 5 MG PO TABS
5.0000 mg | ORAL_TABLET | Freq: Every evening | ORAL | Status: DC | PRN
  Administered 2023-01-11: 5 mg via ORAL
  Filled 2023-01-11 (×2): qty 1

## 2023-01-11 MED ORDER — ONDANSETRON 4 MG PO TBDP
8.0000 mg | ORAL_TABLET | Freq: Once | ORAL | Status: AC
  Administered 2023-01-11: 8 mg via ORAL
  Filled 2023-01-11: qty 2

## 2023-01-11 MED ORDER — FENTANYL CITRATE PF 50 MCG/ML IJ SOSY
100.0000 ug | PREFILLED_SYRINGE | Freq: Once | INTRAMUSCULAR | Status: AC
  Administered 2023-01-11: 100 ug via INTRAVENOUS
  Filled 2023-01-11: qty 2

## 2023-01-11 MED ORDER — LACTATED RINGERS IV SOLN: INTRAVENOUS | Status: DC

## 2023-01-11 MED ORDER — OXYCODONE HCL 5 MG PO TABS
5.0000 mg | ORAL_TABLET | ORAL | Status: DC | PRN
  Administered 2023-01-11 – 2023-01-13 (×8): 10 mg via ORAL
  Filled 2023-01-11 (×8): qty 2

## 2023-01-11 MED ORDER — MORPHINE SULFATE (PF) 2 MG/ML IV SOLN
2.0000 mg | INTRAVENOUS | Status: DC | PRN
  Administered 2023-01-11: 2 mg via INTRAVENOUS
  Filled 2023-01-11: qty 1

## 2023-01-11 MED ORDER — PHENOL 1.4 % MT LIQD: 1.0000 | OROMUCOSAL | Status: DC | PRN

## 2023-01-11 MED ORDER — METHOCARBAMOL 1000 MG/10ML IJ SOLN: 500.0000 mg | Freq: Four times a day (QID) | INTRAVENOUS | Status: DC | PRN

## 2023-01-11 MED ORDER — ACETAMINOPHEN 325 MG PO TABS: 325.0000 mg | ORAL_TABLET | Freq: Four times a day (QID) | ORAL | Status: DC | PRN

## 2023-01-11 MED ORDER — MENTHOL 3 MG MT LOZG: 1.0000 | LOZENGE | OROMUCOSAL | Status: DC | PRN

## 2023-01-11 MED ORDER — ONDANSETRON HCL 4 MG/2ML IJ SOLN: 4.0000 mg | Freq: Four times a day (QID) | INTRAMUSCULAR | Status: DC | PRN

## 2023-01-11 MED ORDER — DIPHENHYDRAMINE HCL 12.5 MG/5ML PO ELIX: 12.5000 mg | ORAL_SOLUTION | ORAL | Status: DC | PRN

## 2023-01-11 MED ORDER — ONDANSETRON HCL 4 MG PO TABS: 4.0000 mg | ORAL_TABLET | Freq: Four times a day (QID) | ORAL | Status: DC | PRN

## 2023-01-11 MED ORDER — DOCUSATE SODIUM 100 MG PO CAPS
100.0000 mg | ORAL_CAPSULE | Freq: Two times a day (BID) | ORAL | Status: DC
  Administered 2023-01-12: 100 mg via ORAL
  Filled 2023-01-11 (×4): qty 1

## 2023-01-11 MED ORDER — OXYCODONE HCL 5 MG PO TABS
5.0000 mg | ORAL_TABLET | Freq: Four times a day (QID) | ORAL | Status: DC | PRN
  Administered 2023-01-11: 5 mg via ORAL
  Filled 2023-01-11: qty 1

## 2023-01-11 MED ORDER — ACETAMINOPHEN 500 MG PO TABS
1000.0000 mg | ORAL_TABLET | Freq: Four times a day (QID) | ORAL | Status: AC
  Administered 2023-01-11 – 2023-01-12 (×4): 1000 mg via ORAL
  Filled 2023-01-11 (×4): qty 2

## 2023-01-11 MED ORDER — POLYETHYLENE GLYCOL 3350 17 G PO PACK: 17.0000 g | PACK | Freq: Every day | ORAL | Status: DC | PRN

## 2023-01-11 NOTE — ED Notes (Signed)
ED TO INPATIENT HANDOFF REPORT  ED Nurse Name and Phone #: [phone]  S Name/Age/Gender Arthur Ramos 27 y.o. male Room/Bed: 048C/048C  Code Status   Code Status: Prior  Home/SNF/Other Home Patient oriented to: self, place, time, and situation Is this baseline? Yes   Triage Complete: Triage complete  Chief Complaint Leg pain [M79.606]  Triage Note Pt arrives with c/o left leg pain. Pt recently discharged after having multiple leg surgeries. Per pt, he got up to go to the bathroom and fell on the leg he had surgery on. Per pt, he does not have any to stay with him and doesn't think he can stay by himself anymore.    Allergies No Known Allergies  Level of Care/Admitting Diagnosis ED Disposition     ED Disposition  Admit   Condition  --   Comment  Hospital Area: [facility] [[id number]]  Level of Care: Med-Surg [16]  May admit patient to [facility] or [facility] if equivalent level of care is available:: Yes  Covid Evaluation: Asymptomatic - no recent exposure (last 10 days) testing not required  Diagnosis: Leg pain [454098]  Admitting Physician: Joen Laura [[id number]]  Attending Physician: Joen Laura [[id number]]  Certification:: I certify this patient will need inpatient services for at least 2 midnights  Estimated Length of Stay: 2          B Medical/Surgery History Past Medical History:  Diagnosis Date   Anxiety    Bipolar 1 disorder [facility])    Past Surgical History:  Procedure Laterality Date   APPLICATION OF WOUND VAC Left 01/01/2023   Procedure: APPLICATION OF WOUND VAC X2;  Surgeon: Joen Laura, MD;  Location: [facility];  Service: Orthopedics;  Laterality: Left;   FASCIOTOMY Left 01/01/2023   Procedure: LEG FASCIOTOMY, EXTERNAL FIXATION APPLICATION CLOSED REDUCTION;  Surgeon: Joen Laura, MD;  Location: [facility];  Service: Orthopedics;  Laterality: Left;   SECONDARY CLOSURE OF WOUND Left 01/05/2023    Procedure: WOUND VAC CHANGE AND SECONDARY CLOSURE OF WOUND;  Surgeon: Roby Lofts, MD;  Location: [facility];  Service: Orthopedics;  Laterality: Left;     A IV Location/Drains/Wounds Patient Lines/Drains/Airways Status     Active Line/Drains/Airways     Name Placement date Placement time Site Days   Peripheral IV 01/11/23 20 G Anterior;Distal;Left;Upper Arm 01/11/23  0724  Arm  less than 1   EXTERNAL FIXATOR 01/02/23  0327  Lower Leg  9            Intake/Output Last 24 hours No intake or output data in the 24 hours ending 01/11/23 0859  Labs/Imaging Results for orders placed or performed during the hospital encounter of 01/11/23 (from the past 48 hour(s))  CBC with Differential     Status: Abnormal   Collection Time: 01/11/23  1:19 AM  Result Value Ref Range   WBC 14.6 (H) 4.0 - 10.5 K/uL   RBC 2.54 (L) 4.22 - 5.81 MIL/uL   Hemoglobin 7.0 (L) 13.0 - 17.0 g/dL   HCT 08.6 (L) 57.8 - 46.9 %   MCV 88.2 80.0 - 100.0 fL   MCH 27.6 26.0 - 34.0 pg   MCHC 31.3 30.0 - 36.0 g/dL   RDW 62.9 52.8 - 41.3 %   Platelets 627 (H) 150 - 400 K/uL   nRBC 0.0 0.0 - 0.2 %   Neutrophils Relative % 71 %   Neutro Abs 10.5 (H) 1.7 - 7.7 K/uL   Lymphocytes  Relative 18 %   Lymphs Abs 2.6 0.7 - 4.0 K/uL   Monocytes Relative 7 %   Monocytes Absolute 1.0 0.1 - 1.0 K/uL   Eosinophils Relative 1 %   Eosinophils Absolute 0.1 0.0 - 0.5 K/uL   Basophils Relative 1 %   Basophils Absolute 0.1 0.0 - 0.1 K/uL   Immature Granulocytes 2 %   Abs Immature Granulocytes 0.30 (H) 0.00 - 0.07 K/uL    Comment: Performed at [facility], [address], [place], [place] [address]  Basic metabolic panel     Status: Abnormal   Collection Time: 01/11/23  1:19 AM  Result Value Ref Range   Sodium 136 135 - 145 mmol/L   Potassium 3.7 3.5 - 5.1 mmol/L   Chloride 102 98 - 111 mmol/L   CO2 23 22 - 32 mmol/L   Glucose, Bld 110 (H) 70 - 99 mg/dL    Comment: Glucose reference range applies only to samples taken  after fasting for at least 8 hours.   BUN 9 6 - 20 mg/dL   Creatinine, Ser 6.04 0.61 - 1.24 mg/dL   Calcium 8.4 (L) 8.9 - 10.3 mg/dL   GFR, Estimated >54 >09 mL/min    Comment: (NOTE) Calculated using the CKD-EPI Creatinine Equation (2021)    Anion gap 11 5 - 15    Comment: Performed at [facility], [address], [place], [place] [address]   DG Knee Left Port  Result Date: 01/11/2023 CLINICAL DATA:  27 year old male with history of trauma from a fall. EXAM: PORTABLE LEFT KNEE - 1-2 VIEW COMPARISON:  Left knee radiograph 01/07/2023. FINDINGS: Highly comminuted fracture of the tibial plateau is again noted with lateral plate and screw fixation device traversing the fracture, along with 2 cannulated fixation screws extending through the medial aspect of the tibial plateau, similar to the prior study. Gas in the overlying soft tissues has regressed, although there continues to be a small amount of gas in the joint space. IMPRESSION: 1. No new acute findings. Postoperative changes of ORIF for tibial plateau fracture, similar to the prior study with resolving postoperative gas in the soft tissues. Electronically Signed   By: Trudie Reed M.D.   On: 01/11/2023 07:43   DG Femur Min 2 Views Left  Result Date: 01/11/2023 CLINICAL DATA:  Left lower extremity pain. EXAM: LEFT ANKLE COMPLETE - 3+ VIEW; LEFT TIBIA AND FIBULA - 2 VIEW; LEFT FEMUR 2 VIEWS COMPARISON:  None Available. FINDINGS: Internal fixation of comminuted proximal tibial fracture with sideplate and screws. The hardware is intact. There is approximately 4 mm gap between the proximal fixation sideplate and tibial cortex. No other acute fracture. Evidence of prior fixation of the femur. No dislocation. Probable small suprapatellar effusion. The soft tissues are unremarkable. IMPRESSION: 1. No acute fracture or dislocation. 2. Internal fixation of comminuted proximal tibial fracture. Electronically Signed   By: Elgie Collard M.D.    On: 01/11/2023 02:17   DG Tibia/Fibula Left  Result Date: 01/11/2023 CLINICAL DATA:  Left lower extremity pain. EXAM: LEFT ANKLE COMPLETE - 3+ VIEW; LEFT TIBIA AND FIBULA - 2 VIEW; LEFT FEMUR 2 VIEWS COMPARISON:  None Available. FINDINGS: Internal fixation of comminuted proximal tibial fracture with sideplate and screws. The hardware is intact. There is approximately 4 mm gap between the proximal fixation sideplate and tibial cortex. No other acute fracture. Evidence of prior fixation of the femur. No dislocation. Probable small suprapatellar effusion. The soft tissues are unremarkable. IMPRESSION:  1. No acute fracture or dislocation. 2. Internal fixation of comminuted proximal tibial fracture. Electronically Signed   By: Elgie Collard M.D.   On: 01/11/2023 02:17   DG Ankle Complete Left  Result Date: 01/11/2023 CLINICAL DATA:  Left lower extremity pain. EXAM: LEFT ANKLE COMPLETE - 3+ VIEW; LEFT TIBIA AND FIBULA - 2 VIEW; LEFT FEMUR 2 VIEWS COMPARISON:  None Available. FINDINGS: Internal fixation of comminuted proximal tibial fracture with sideplate and screws. The hardware is intact. There is approximately 4 mm gap between the proximal fixation sideplate and tibial cortex. No other acute fracture. Evidence of prior fixation of the femur. No dislocation. Probable small suprapatellar effusion. The soft tissues are unremarkable. IMPRESSION: 1. No acute fracture or dislocation. 2. Internal fixation of comminuted proximal tibial fracture. Electronically Signed   By: Elgie Collard M.D.   On: 01/11/2023 02:17    Pending Labs Unresulted Labs (From admission, onward)    None       Vitals/Pain Today's Vitals   01/11/23 0727 01/11/23 0801 01/11/23 0817 01/11/23 0849  BP: (!) 153/103 (!) 152/88    Pulse: (!) 110 (!) 105    Resp: 18     Temp:   98 F (36.7 C)   TempSrc:  Oral Oral   SpO2: 100% 100%    Weight:      PainSc:    6     Isolation Precautions No active  isolations  Medications Medications  acetaminophen (TYLENOL) tablet 1,000 mg (1,000 mg Oral Given 01/11/23 0128)  HYDROcodone-acetaminophen (NORCO/VICODIN) 5-325 MG per tablet 2 tablet (2 tablets Oral Given 01/11/23 0347)  ondansetron (ZOFRAN-ODT) disintegrating tablet 8 mg (8 mg Oral Given 01/11/23 0717)  fentaNYL (SUBLIMAZE) injection 100 mcg (100 mcg Intravenous Given 01/11/23 0727)    Mobility walks with device (crutches)     Focused Assessments LLE 1+ edema, warm, dry, sensation intact, pedal pulse 2+, posterior tibial pulse 2+   R Recommendations: See Admitting Provider Note  Report given to:   Additional Notes:   Pt arrives with c/o left leg pain. Pt recently discharged after having multiple leg surgeries. Per pt, he got up to go to the bathroom and fell on the leg he had surgery on. Per pt, he does not have any to stay with him and doesn't think he can stay by himself anymore.

## 2023-01-11 NOTE — Consult Note (Signed)
Time Arrived at the Bedside: 0900  ORTHOPAEDIC CONSULTATION  REQUESTING PHYSICIAN: Zadie Rhine, MD  Chief Complaint: Fall, left leg pain   HPI: Arthur Ramos is a 27 y.o. male who complains of fall with left leg pain.  Recent history of left tibial plateau fracture with compartment syndrome that required ORIF and fasciotomy.  Was discharged yesterday.  When he got home was doing well until he tried to use his crutches to go to the bathroom, became unbalanced on his crutches, and fell onto his left side.  He denies numbness or tingling of left leg.  Reports some increased pain since after falling.  Denies any other injuries from the fall and describes the fall as purely mechanical.  Patient also states he has no one to help him at home, is currently living by himself.  Surgeries from recent admission course:  - Fasciotomy with Ex-Fix on Jan 02, 2023.   - Secondary closure with wound VAC on Jan 05, 2023 - ORIF of left tibial plateau fracture, with hardware removal and medial meniscus repair on Jan 07, 2023  Past Medical History:  Diagnosis Date   Anxiety    Bipolar 1 disorder [facility])    Past Surgical History:  Procedure Laterality Date   APPLICATION OF WOUND VAC Left 01/01/2023   Procedure: APPLICATION OF WOUND VAC X2;  Surgeon: Joen Laura, MD;  Location: [facility];  Service: Orthopedics;  Laterality: Left;   FASCIOTOMY Left 01/01/2023   Procedure: LEG FASCIOTOMY, EXTERNAL FIXATION APPLICATION CLOSED REDUCTION;  Surgeon: Joen Laura, MD;  Location: [facility];  Service: Orthopedics;  Laterality: Left;   SECONDARY CLOSURE OF WOUND Left 01/05/2023   Procedure: WOUND VAC CHANGE AND SECONDARY CLOSURE OF WOUND;  Surgeon: Roby Lofts, MD;  Location: [facility] OR;  Service: Orthopedics;  Laterality: Left;   Social History   Socioeconomic History   Marital status: Single    Spouse name: Not on file   Number of children: Not on file   Years of education: Not on file   Highest  education level: Not on file  Occupational History   Not on file  Tobacco Use   Smoking status: Every Day    Types: Cigarettes, Cigars   Smokeless tobacco: Never  Vaping Use   Vaping Use: Never used  Substance and Sexual Activity   Alcohol use: Yes    Alcohol/week: 30.0 standard drinks of alcohol    Types: 30 Standard drinks or equivalent per week   Drug use: Yes    Types: Marijuana   Sexual activity: Yes  Other Topics Concern   Not on file  Social History Narrative   ** Merged History Encounter **       ** Merged History Encounter **       Social Determinants of Health   Financial Resource Strain: Not on file  Food Insecurity: No Food Insecurity (01/02/2023)   Hunger Vital Sign    Worried About Running Out of Food in the Last Year: Never true    Ran Out of Food in the Last Year: Never true  Transportation Needs: No Transportation Needs (01/02/2023)   PRAPARE - Administrator, Civil Service (Medical): No    Lack of Transportation (Non-Medical): No  Physical Activity: Not on file  Stress: Not on file  Social Connections: Not on file   Family History  Problem Relation Age of Onset   Healthy Mother    Healthy Father    No Known Allergies  Positive ROS: All other systems have been reviewed and were otherwise negative with the exception of those mentioned in the HPI and as above.  Physical Exam: General: Alert, no acute distress Cardiovascular: No pedal edema Respiratory: No cyanosis, no use of accessory musculature Skin: No lesions in the area of chief complaint Neurologic: Sensation intact distally Psychiatric: Patient is competent for consent with normal mood and affect  MUSCULOSKELETAL: Left lower extremity with Ace wrap in place, clean dry and intact.  Noted swelling and tenderness of the left foot though without discoloration, and with mild warm to touch.  Distal pulses intact.  Dorsiflexion plantarflexion intact.  No significant pain with passive  dorsiflexion.  No leg shortening.  No hip or thigh tenderness.  Hip flexion intact.  Wiggles toes appropriately.  Compartments soft.  IMAGING: No acute fracture/dislocation, or postop changes.  Hardware appears in place without adverse features.  Assessment: Principal Problem:   Leg pain   Status post ORIF and fasciotomy of left tibial plateau fracture with compartment syndrome, with recent fall  Plan: Discussed patient case with attending Dr. Blanchie Dessert.  Considering exam and imaging, low suspicion of acute fracture or complication of hardware from fall.  Unfortunately patient does not have any help at home and has been trying to manage ADLs by himself.  Patient also states his pain is not being well-managed with prescriptions sent home with him.  Recommend admission for coordination of resources and for pain control.  Patient has already received 2 Vicodin and 1000 mg acetaminophen, in addition to 100 mcg of fentanyl.  Patient reports notable pain relief with the fentanyl, though still rated 7/10.  Thus plan to use caution with acetaminophen.  PRN Morphine and Oxycodone added for pain control.  I discussed this with the patient, who is in agreement and has no further questions.  Patient will be admitted to orthopedic service.   Cecil Cobbs, PA-C  Contact information:   [phone] 7am-5pm epic message Dr. Blanchie Dessert, or call office for patient follow up: [phone] After hours and holidays please check [email or web address] for group call information for [facility]

## 2023-01-11 NOTE — ED Notes (Signed)
Notified 5N of pt being transported to floor

## 2023-01-11 NOTE — ED Provider Notes (Signed)
[place] EMERGENCY DEPARTMENT AT [facility] Provider Note   CSN: [id number] Arrival date & time: 01/11/23  0049     History  Chief Complaint  Patient presents with   Leg Pain    BRINSON GUTOWSKI is a 27 y.o. male.   Leg Pain     Patient with recent history of left tibial plateau fracture with compartment syndrome that required ORIF and fasciotomy Patient was just discharged in the hospital yesterday After going home he had an accidental fall while trying going to the bathroom and is now having increasing pain in his left leg.  No new weakness or numbness in his foot.  No other injuries reported in the fall.  No head injury or LOC  Home Medications Prior to Admission medications   Medication Sig Start Date End Date Taking? Authorizing Provider  acetaminophen (TYLENOL) 500 MG tablet Take 2 tablets (1,000 mg total) by mouth every 6 (six) hours as needed for mild pain, fever or headache. 01/09/23   [place]ali, PA-C  ferrous sulfate 325 (65 FE) MG tablet Take 1 tablet (325 mg total) by mouth daily with breakfast for 7 days. 01/09/23 01/16/23  [place]ali, PA-C  methocarbamol (ROBAXIN) 500 MG tablet Take 1 tablet (500 mg total) by mouth every 6 (six) hours as needed for muscle spasms. 01/09/23   [place]ali, PA-C  oxyCODONE 10 MG TABS Take 1 tablet (10 mg total) by mouth every 4 (four) hours as needed for severe pain. 01/09/23   [place] Bali, PA-C  Vitamin D, Ergocalciferol, (DRISDOL) 1.25 MG (50000 UNIT) CAPS capsule Take 1 capsule (50,000 Units total) by mouth every 7 (seven) days. 01/13/23   [place] Bali, PA-C      Allergies    Patient has no known allergies.    Review of Systems   Review of Systems  Musculoskeletal:  Positive for arthralgias.  Neurological:  Negative for headaches.    Physical Exam Updated Vital Signs BP (!) 145/87   Pulse (!) 110   Temp 98 F (36.7 C)   Resp 18   Wt 73.9 kg   SpO2 100%   BMI 24.07 kg/m  Physical  Exam CONSTITUTIONAL: Well developed/well nourished HEAD: Normocephalic/atraumatic EYES: EOMI ENMT: Mucous membranes moist NECK: supple no meningeal signs SPINE/BACK:entire spine nontender CV: S1/S2 noted, no murmurs/rubs/gallops noted LUNGS: Lungs are clear to auscultation bilaterally, no apparent distress ABDOMEN: soft, nontender, NEURO: Pt is awake/alert/appropriate Able to wiggle toes on left foot, no sensory deficit EXTREMITIES: Swelling and tenderness noted to the left foot, the left foot is warm to touch.  Distal pulses intact Left lower extremity has Ace wrap in place SKIN: warm, color normal PSYCH: Anxious ED Results / Procedures / Treatments   Labs (all labs ordered are listed, but only abnormal results are displayed) Labs Reviewed  CBC WITH DIFFERENTIAL/PLATELET - Abnormal; Notable for the following components:      Result Value   WBC 14.6 (*)    RBC 2.54 (*)    Hemoglobin 7.0 (*)    HCT 22.4 (*)    Platelets 627 (*)    Neutro Abs 10.5 (*)    Abs Immature Granulocytes 0.30 (*)    All other components within normal limits  BASIC METABOLIC PANEL - Abnormal; Notable for the following components:   Glucose, Bld 110 (*)    Calcium 8.4 (*)    All other components within normal limits    EKG None  Radiology DG Femur  Min 2 Views Left  Result Date: 01/11/2023 CLINICAL DATA:  Left lower extremity pain. EXAM: LEFT ANKLE COMPLETE - 3+ VIEW; LEFT TIBIA AND FIBULA - 2 VIEW; LEFT FEMUR 2 VIEWS COMPARISON:  None Available. FINDINGS: Internal fixation of comminuted proximal tibial fracture with sideplate and screws. The hardware is intact. There is approximately 4 mm gap between the proximal fixation sideplate and tibial cortex. No other acute fracture. Evidence of prior fixation of the femur. No dislocation. Probable small suprapatellar effusion. The soft tissues are unremarkable. IMPRESSION: 1. No acute fracture or dislocation. 2. Internal fixation of comminuted proximal tibial  fracture. Electronically Signed   By: Elgie Collard M.D.   On: 01/11/2023 02:17   DG Tibia/Fibula Left  Result Date: 01/11/2023 CLINICAL DATA:  Left lower extremity pain. EXAM: LEFT ANKLE COMPLETE - 3+ VIEW; LEFT TIBIA AND FIBULA - 2 VIEW; LEFT FEMUR 2 VIEWS COMPARISON:  None Available. FINDINGS: Internal fixation of comminuted proximal tibial fracture with sideplate and screws. The hardware is intact. There is approximately 4 mm gap between the proximal fixation sideplate and tibial cortex. No other acute fracture. Evidence of prior fixation of the femur. No dislocation. Probable small suprapatellar effusion. The soft tissues are unremarkable. IMPRESSION: 1. No acute fracture or dislocation. 2. Internal fixation of comminuted proximal tibial fracture. Electronically Signed   By: Elgie Collard M.D.   On: 01/11/2023 02:17   DG Ankle Complete Left  Result Date: 01/11/2023 CLINICAL DATA:  Left lower extremity pain. EXAM: LEFT ANKLE COMPLETE - 3+ VIEW; LEFT TIBIA AND FIBULA - 2 VIEW; LEFT FEMUR 2 VIEWS COMPARISON:  None Available. FINDINGS: Internal fixation of comminuted proximal tibial fracture with sideplate and screws. The hardware is intact. There is approximately 4 mm gap between the proximal fixation sideplate and tibial cortex. No other acute fracture. Evidence of prior fixation of the femur. No dislocation. Probable small suprapatellar effusion. The soft tissues are unremarkable. IMPRESSION: 1. No acute fracture or dislocation. 2. Internal fixation of comminuted proximal tibial fracture. Electronically Signed   By: Elgie Collard M.D.   On: 01/11/2023 02:17    Procedures Procedures    Medications Ordered in ED Medications  ondansetron (ZOFRAN-ODT) disintegrating tablet 8 mg (has no administration in time range)  fentaNYL (SUBLIMAZE) injection 100 mcg (has no administration in time range)  acetaminophen (TYLENOL) tablet 1,000 mg (1,000 mg Oral Given 01/11/23 0128)   HYDROcodone-acetaminophen (NORCO/VICODIN) 5-325 MG per tablet 2 tablet (2 tablets Oral Given 01/11/23 0347)    ED Course/ Medical Decision Making/ A&P Clinical Course as of 01/11/23 0649  Sun Jan 11, 2023  0307 Hemoglobin(!): 7.0 Chronic anemia [DW]  0335 Patient was just discharged from the hospital after ORIF and fasciotomy.  Patient went home and fell while going to the bathroom.  No acute fractures noted on x-ray.  Distal pulses intact in his foot is warm to touch, low suspicion for compartment syndrome.  Treat pain and reassess [DW]  0623 D/w Kathie Dike with ortho She just discharged the patient.  She will consult with her attending and likely round of the patient he may need to be readmitted Her number is [phone] [DW]  [phone] Discussed with Dr. Oswaldo Milian with Ortho. He recommends patient to be readmitted to their service. Patient updated on plan [DW]  [phone] Patient was given Tylenol several hours ago, then given Vicodin  2 hrs later.  Would avoid Tylenol for now, will place IV and give IV pain medicine [DW]    Clinical Course User Index [  DW] Zadie Rhine, MD                             Medical Decision Making Amount and/or Complexity of Data Reviewed Labs: ordered. Decision-making details documented in ED Course. Radiology: ordered.  Risk OTC drugs. Prescription drug management. Decision regarding hospitalization.           Final Clinical Impression(s) / ED Diagnoses Final diagnoses:  Left leg pain    Rx / DC Orders ED Discharge Orders     None         Zadie Rhine, MD 01/11/23 [phone]

## 2023-01-11 NOTE — ED Notes (Signed)
Pt is on 5 lead and on the monitor at this time Pt was given an warm blanket at this time as well Bed lowered and locked

## 2023-01-11 NOTE — ED Notes (Signed)
Patient transported to X-ray 
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
## 2023-01-11 NOTE — H&P (Signed)
Chief Complaint: Fall, left leg pain      HPI: Arthur Ramos is a 27 y.o. male who complains of fall with left leg pain.  Recent history of left tibial plateau fracture with compartment syndrome that required ORIF and fasciotomy.  Was discharged yesterday.  When he got home was doing well until he tried to use his crutches to go to the bathroom, became unbalanced on his crutches, and fell onto his left side.  He denies numbness or tingling of left leg.  Reports some increased pain since after falling.  Denies any other injuries from the fall and describes the fall as purely mechanical.  Patient also states he has no one to help him at home, is currently living by himself.   Surgeries from recent admission course:  - Fasciotomy with Ex-Fix on Jan 02, 2023.   - Secondary closure with wound VAC on Jan 05, 2023 - ORIF of left tibial plateau fracture, with hardware removal and medial meniscus repair on Jan 07, 2023       Past Medical History:  Diagnosis Date   Anxiety     Bipolar 1 disorder [facility])           Past Surgical History:  Procedure Laterality Date   APPLICATION OF WOUND VAC Left 01/01/2023    Procedure: APPLICATION OF WOUND VAC X2;  Surgeon: Joen Laura, MD;  Location: [facility];  Service: Orthopedics;  Laterality: Left;   FASCIOTOMY Left 01/01/2023    Procedure: LEG FASCIOTOMY, EXTERNAL FIXATION APPLICATION CLOSED REDUCTION;  Surgeon: Joen Laura, MD;  Location: [facility];  Service: Orthopedics;  Laterality: Left;   SECONDARY CLOSURE OF WOUND Left 01/05/2023    Procedure: WOUND VAC CHANGE AND SECONDARY CLOSURE OF WOUND;  Surgeon: Roby Lofts, MD;  Location: [facility] OR;  Service: Orthopedics;  Laterality: Left;    Social History         Socioeconomic History   Marital status: Single      Spouse name: Not on file   Number of children: Not on file   Years of education: Not on file   Highest education level: Not on file  Occupational History   Not on file  Tobacco Use    Smoking status: Every Day      Types: Cigarettes, Cigars   Smokeless tobacco: Never  Vaping Use   Vaping Use: Never used  Substance and Sexual Activity   Alcohol use: Yes      Alcohol/week: 30.0 standard drinks of alcohol      Types: 30 Standard drinks or equivalent per week   Drug use: Yes      Types: Marijuana   Sexual activity: Yes  Other Topics Concern   Not on file  Social History Narrative    ** Merged History Encounter **         ** Merged History Encounter **         Social Determinants of Health        Financial Resource Strain: Not on file  Food Insecurity: No Food Insecurity (01/02/2023)    Hunger Vital Sign     Worried About Running Out of Food in the Last Year: Never true     Ran Out of Food in the Last Year: Never true  Transportation Needs: No Transportation Needs (01/02/2023)    PRAPARE - Therapist, art (Medical): No     Lack of Transportation (Non-Medical): No  Physical Activity: Not on file  Stress: Not on file  Social Connections: Not on file         Family History  Problem Relation Age of Onset   Healthy Mother     Healthy Father      No Known Allergies     Positive ROS: All other systems have been reviewed and were otherwise negative with the exception of those mentioned in the HPI and as above.   Physical Exam: General: Alert, no acute distress Cardiovascular: No pedal edema Respiratory: No cyanosis, no use of accessory musculature Skin: No lesions in the area of chief complaint Neurologic: Sensation intact distally Psychiatric: Patient is competent for consent with normal mood and affect   MUSCULOSKELETAL: Left lower extremity with Ace wrap in place, clean dry and intact.  Noted swelling and tenderness of the left foot though without discoloration, and with mild warm to touch.  Distal pulses intact.  Dorsiflexion plantarflexion intact.  No significant pain with passive dorsiflexion.  No leg shortening.  No hip  or thigh tenderness.  Hip flexion intact.  Wiggles toes appropriately.  Compartments soft.   IMAGING: No acute fracture/dislocation, or postop changes.  Hardware appears in place without adverse features.   Assessment: Principal Problem:   Leg pain     Status post ORIF and fasciotomy of left tibial plateau fracture with compartment syndrome, with recent fall   Plan: Discussed patient case with attending Dr. Blanchie Dessert.  Considering exam and imaging, low suspicion of acute fracture or complication of hardware from fall.  Unfortunately patient does not have any help at home and has been trying to manage ADLs by himself.  Patient also states his pain is not being well-managed with prescriptions sent home with him.  Recommend admission for coordination of resources and for pain control.  Patient has already received 2 Vicodin and 1000 mg acetaminophen, in addition to 100 mcg of fentanyl.  Patient reports notable pain relief with the fentanyl, though still rated 7/10.  Thus plan to use caution with acetaminophen.  PRN Morphine and Oxycodone added for pain control.  I discussed this with the patient, who is in agreement and has no further questions.  Patient will be admitted to orthopedic service.   Weber Cooks, MD Orthopaedic Surgery   Contact information:   [phone] 7am-5pm epic message Dr. Blanchie Dessert, or call office for patient follow up: [phone] After hours and holidays please check [email or web address] for group call information for [facility]

## 2023-01-11 NOTE — ED Notes (Signed)
Messaged Marchwiany MD about orders for pain management.

## 2023-01-11 NOTE — ED Triage Notes (Signed)
Pt arrives with c/o left leg pain. Pt recently discharged after having multiple leg surgeries. Per pt, he got up to go to the bathroom and fell on the leg he had surgery on. Per pt, he does not have any to stay with him and doesn't think he can stay by himself anymore.

## 2023-01-12 MED ORDER — ASPIRIN 325 MG PO TABS
325.0000 mg | ORAL_TABLET | Freq: Every day | ORAL | Status: DC
  Administered 2023-01-12 – 2023-01-13 (×2): 325 mg via ORAL
  Filled 2023-01-12 (×2): qty 1

## 2023-01-12 MED ORDER — KETOROLAC TROMETHAMINE 15 MG/ML IJ SOLN
15.0000 mg | Freq: Four times a day (QID) | INTRAMUSCULAR | Status: DC
  Administered 2023-01-12 – 2023-01-13 (×4): 15 mg via INTRAVENOUS
  Filled 2023-01-12 (×4): qty 1

## 2023-01-12 MED ORDER — ACETAMINOPHEN 325 MG PO TABS
650.0000 mg | ORAL_TABLET | Freq: Four times a day (QID) | ORAL | Status: DC
  Administered 2023-01-12 – 2023-01-13 (×3): 650 mg via ORAL
  Filled 2023-01-12 (×3): qty 2

## 2023-01-12 NOTE — Evaluation (Signed)
Occupational Therapy Evaluation and DC Summary Patient Details Name: Arthur Ramos MRN: [id number] DOB: 1996/01/17 Today's Date: 01/12/2023   History of Present Illness 27 y.o. male who complains of fall with left leg pain. Recently discharged 5/18 following s/p 4 compartment left lower extremity fasciotomy, application of knee spanning external fixator with closed reduction of the tibial plateau fracture, application of wound VAC to the medial lateral fasciotomy sites on 01/02/23. Return to OR for closure of L lateral fasciotomy wound and wound vac replacement on 01/05/23. Secondary closure of wound with ORIF tibial plateau 5/15. PMH: anxiety, bipolar 1 disoder   Clinical Impression   Pt admitted for above dx, Pt recently DC from [facility] and reports being independent/Mod I  in ADLs and has been using crutches since recent DC. Pt ambulating , completing bADLs, and transferring with Supervision, pt presenting close to functional baseline and has no further acute skilled OT needs. Pt does exhibit difficult flexing L knee, discussed with PT the need to further assess deficits. No follow-up OT recommended, pt would benefit from continue mobility with mobility specialists while in acute setting.      Recommendations for follow up therapy are one component of a multi-disciplinary discharge planning process, led by the attending physician.  Recommendations may be updated based on patient status, additional functional criteria and insurance authorization.   Assistance Recommended at Discharge Intermittent Supervision/Assistance  Patient can return home with the following Help with stairs or ramp for entrance;Assist for transportation;Assistance with cooking/housework    Functional Status Assessment  Patient has not had a recent decline in their functional status  Equipment Recommendations  None recommended by OT    Recommendations for Other Services       Precautions / Restrictions  Precautions Precautions: Fall Restrictions Weight Bearing Restrictions: Yes LLE Weight Bearing: Non weight bearing      Mobility Bed Mobility Overal bed mobility: Modified Independent             General bed mobility comments: Pt assisting LLE    Transfers Overall transfer level: Needs assistance Equipment used: Crutches Transfers: Sit to/from Stand Sit to Stand: Supervision                  Balance Overall balance assessment: Mild deficits observed, not formally tested                                         ADL either performed or assessed with clinical judgement   ADL Overall ADL's : Needs assistance/impaired Eating/Feeding: Independent   Grooming: Standing;Supervision/safety   Upper Body Bathing: Sitting;Set up;Supervision/ safety   Lower Body Bathing: Sitting/lateral leans;Set up;Supervison/ safety   Upper Body Dressing : Sitting;Supervision/safety;Set up   Lower Body Dressing: Supervision/safety Lower Body Dressing Details (indicate cue type and reason): donned L sock while supine in bed Toilet Transfer: Ambulation;Supervision/safety (+ Crutches)   Toileting- Clothing Manipulation and Hygiene: Supervision/safety;Sitting/lateral lean   Tub/ Shower Transfer: Supervision/safety;Ambulation;Set up   Functional mobility during ADLs: Supervision/safety (with Crutches)       Vision         Perception     Praxis      Pertinent Vitals/Pain Pain Assessment Pain Assessment: Faces Faces Pain Scale: Hurts little more Pain Location: LLE Pain Descriptors / Indicators: Aching, Discomfort, Guarding Pain Intervention(s): Limited activity within patient's tolerance, Monitored during session, Repositioned     Hand Dominance Right  Extremity/Trunk Assessment Upper Extremity Assessment Upper Extremity Assessment: Overall WFL for tasks assessed   Lower Extremity Assessment Lower Extremity Assessment: Defer to PT evaluation (pt  unable to actively flex L knee)   Cervical / Trunk Assessment Cervical / Trunk Assessment: Normal   Communication Communication Communication: No difficulties   Cognition Arousal/Alertness: Awake/alert Behavior During Therapy: WFL for tasks assessed/performed Overall Cognitive Status: Within Functional Limits for tasks assessed                                       General Comments  LLE swelling, pt reports LLE stiffness and unable to flex L knee    Exercises     Shoulder Instructions      Home Living Family/patient expects to be discharged to:: Private residence Living Arrangements: Alone   Type of Home: Apartment Home Access: Level entry     Home Layout: One level     Bathroom Shower/Tub: Chief Strategy Officer: Standard     Home Equipment: None          Prior Functioning/Environment Prior Level of Function : Independent/Modified Independent;Working/employed;Driving             Mobility Comments: pt reports ind ADLs Comments: pt reports ind        OT Problem List:        OT Treatment/Interventions:      OT Goals(Current goals can be found in the care plan section) Acute Rehab OT Goals Patient Stated Goal: to go home OT Goal Formulation: With patient Time For Goal Achievement: 01/26/23 Potential to Achieve Goals: Good  OT Frequency:      Co-evaluation              AM-PAC OT "6 Clicks" Daily Activity     Outcome Measure Help from another person eating meals?: None Help from another person taking care of personal grooming?: A Little Help from another person toileting, which includes using toliet, bedpan, or urinal?: A Little Help from another person bathing (including washing, rinsing, drying)?: A Little Help from another person to put on and taking off regular upper body clothing?: A Little Help from another person to put on and taking off regular lower body clothing?: A Little 6 Click Score: 19   End of  Session Equipment Utilized During Treatment: Gait belt;Other (comment) (cructhes) Nurse Communication: Mobility status  Activity Tolerance: Patient tolerated treatment well Patient left: in bed;with call bell/phone within reach  OT Visit Diagnosis: History of falling (Z91.81);Pain Pain - Right/Left: Left Pain - part of body: Leg                Time: 1610-9604 OT Time Calculation (min): 19 min Charges:  OT General Charges $OT Visit: 1 Visit OT Evaluation $OT Eval Low Complexity: 1 Low  01/12/2023  AB, OTR/L  Acute Rehabilitation Services  Office: [phone]   Tristan Schroeder 01/12/2023, 12:05 PM

## 2023-01-12 NOTE — Plan of Care (Signed)
  Problem: Activity: Goal: Risk for activity intolerance will decrease Outcome: Progressing   Problem: Nutrition: Goal: Adequate nutrition will be maintained Outcome: Progressing   Problem: Coping: Goal: Level of anxiety will decrease Outcome: Progressing   Problem: Elimination: Goal: Will not experience complications related to bowel motility Outcome: Progressing   

## 2023-01-12 NOTE — Evaluation (Signed)
Physical Therapy Evaluation and Discharge Patient Details Name: Arthur Ramos MRN: [id number] DOB: 19-Feb-1996 Today's Date: 01/12/2023  History of Present Illness  27 y.o. male who complains of fall with left leg pain. Recently discharged 5/18 following s/p 4 compartment left lower extremity fasciotomy, application of knee spanning external fixator with closed reduction of the tibial plateau fracture, application of wound VAC to the medial lateral fasciotomy sites on 01/02/23. Return to OR for closure of L lateral fasciotomy wound and wound vac replacement on 01/05/23. Secondary closure of wound with ORIF tibial plateau 5/15. PMH: anxiety, bipolar 1 disoder  Clinical Impression   Patient evaluated by Physical Therapy with no further acute PT needs identified. All education has been completed and the patient has no further questions. Focused on progressive amb with crutches, formal stair training, and therex for L LE strengthening and L knee ROM; Return demonstrated well; OK for dc home from PT standpoint See below for any follow-up Physical Therapy or equipment needs. PT is signing off. Thank you for this referral.        Recommendations for follow up therapy are one component of a multi-disciplinary discharge planning process, led by the attending physician.  Recommendations may be updated based on patient status, additional functional criteria and insurance authorization.  Follow Up Recommendations  The potential need for Outpatient PT can be addressed at Ortho follow-up appointments.      Assistance Recommended at Discharge Intermittent Supervision/Assistance  Patient can return home with the following  Assistance with cooking/housework;Assist for transportation    Equipment Recommendations None recommended by PT  Recommendations for Other Services       Functional Status Assessment Patient has had a recent decline in their functional status and demonstrates the ability to make  significant improvements in function in a reasonable and predictable amount of time.     Precautions / Restrictions Precautions Precautions: None Precaution Comments: fall risk present, but minimal Restrictions Weight Bearing Restrictions: Yes LLE Weight Bearing: Non weight bearing      Mobility  Bed Mobility Overal bed mobility: Modified Independent             General bed mobility comments: Pt assisting LLE with hands    Transfers Overall transfer level: Modified independent Equipment used: Crutches Transfers: Sit to/from Stand             General transfer comment: Managing crutches well    Ambulation/Gait Ambulation/Gait assistance: Supervision, Modified independent (Device/Increase time) Gait Distance (Feet): 400 Feet Assistive device: Crutches Gait Pattern/deviations: Step-through pattern       General Gait Details: Good maintenance of LLE NWB  Stairs Stairs: Yes Stairs assistance: Supervision Stair Management: No rails, One rail Left, Step to pattern, Forwards, With crutches Number of Stairs: 3 (x4) General stair comments: Cues for sequence; went up/down 3 steps with 2 crutches and with crutch and rail  Wheelchair Mobility    Modified Rankin (Stroke Patients Only)       Balance Overall balance assessment: No apparent balance deficits (not formally assessed)                                           Pertinent Vitals/Pain Pain Assessment Pain Assessment: Faces Faces Pain Scale: Hurts little more Pain Location: LLE Pain Descriptors / Indicators: Grimacing Pain Intervention(s): Monitored during session    Home Living Family/patient expects to be discharged to:: Private  residence Living Arrangements: Alone Available Help at Discharge: Family;Available PRN/intermittently Type of Home: Apartment Home Access: Level entry       Home Layout: One level Home Equipment: Crutches      Prior Function Prior Level of  Function : Independent/Modified Independent;Working/employed;Driving             Mobility Comments: pt reports ind ADLs Comments: pt reports ind     Hand Dominance   Dominant Hand: Right    Extremity/Trunk Assessment   Upper Extremity Assessment Upper Extremity Assessment: Defer to OT evaluation    Lower Extremity Assessment Lower Extremity Assessment: LLE deficits/detail LLE Deficits / Details: Knee ROM into flexion and extension limited; approx 5-70 AROM, 5-90 with self-AAROM into flexion    Cervical / Trunk Assessment Cervical / Trunk Assessment: Normal  Communication   Communication: No difficulties  Cognition Arousal/Alertness: Awake/alert Behavior During Therapy: WFL for tasks assessed/performed Overall Cognitive Status: Within Functional Limits for tasks assessed                                          General Comments General comments (skin integrity, edema, etc.): Discussed managing at home, minimizing fall risk    Exercises Total Joint Exercises Knee Flexion: AROM, AAROM, 5 reps, Seated (with RLE assisting LLE into more flexion) General Exercises - Lower Extremity Quad Sets: AROM, 10 reps Short Arc Quad: AROM, Left, 10 reps (minimal range) Heel Slides: AAROM, AROM, 10 reps   Assessment/Plan    PT Assessment All further PT needs can be met in the next venue of care  PT Problem List Decreased strength;Decreased range of motion;Decreased activity tolerance;Decreased balance;Decreased mobility;Decreased knowledge of use of DME;Decreased safety awareness;Decreased knowledge of precautions;Pain;Decreased skin integrity       PT Treatment Interventions      PT Goals (Current goals can be found in the Care Plan section)  Acute Rehab PT Goals Patient Stated Goal: better knee ROM PT Goal Formulation: All assessment and education complete, DC therapy    Frequency       Co-evaluation               AM-PAC PT "6 Clicks" Mobility   Outcome Measure Help needed turning from your back to your side while in a flat bed without using bedrails?: None Help needed moving from lying on your back to sitting on the side of a flat bed without using bedrails?: None Help needed moving to and from a bed to a chair (including a wheelchair)?: None Help needed standing up from a chair using your arms (e.g., wheelchair or bedside chair)?: None Help needed to walk in hospital room?: None Help needed climbing 3-5 steps with a railing? : None 6 Click Score: 24    End of Session   Activity Tolerance: Patient tolerated treatment well Patient left: in chair;with call bell/phone within reach Nurse Communication: Mobility status (pt requests bed linen change) PT Visit Diagnosis: Muscle weakness (generalized) (M62.81);Other abnormalities of gait and mobility (R26.89);Pain Pain - Right/Left: Left Pain - part of body: Knee;Leg    Time: 1610-9604 PT Time Calculation (min) (ACUTE ONLY): 37 min   Charges:   PT Evaluation $PT Eval Low Complexity: 1 Low PT Treatments $Gait Training: 8-22 mins        Van Clines, PT  Acute Rehabilitation Services Office [phone] Secure Chat welcomed   Levi Aland 01/12/2023, 2:43 PM

## 2023-01-12 NOTE — Progress Notes (Signed)
Orthopaedic Trauma Progress Note  SUBJECTIVE: Patient discharged home Saturday 01/10/23, re-admitted yesterday after a fall at home. All x-rays negative for new injuries. Patient lives at home alone, doesn't have anyone to help him currently. Friends and family are working to [employer] some furniture at the house to as well as get a group together that could be with him throughout the day. Pain in his left leg manageable. Denies any numbness/tingling. He denies any nausea, vomiting, lightheadedness, dizziness.  No chest pain.  No shortness of breath.    OBJECTIVE:  Vitals:   01/12/23 0430 01/12/23 0907  BP: 117/73 (!) 146/97  Pulse: (!) 108 100  Resp: 16 18  Temp:  98.4 F (36.9 C)  SpO2: 100% 100%    General: Sitting up in bed comfortably, no acute distress  Respiratory: No increased work of breathing.  Left lower extremity: Incisions clean, dry, intact.No significant tenderness about the knee or throughout the lower leg. Ankle dorsiflexion/plantarflexion intact.  No increase in pain with passive motion of the toes.  Able to actively wiggle the toes.  Endorses sensation over all aspects of the foot. Compartments soft and compressible. + DP pulse  IMAGING: Stable postop imaging  LABS:  No results found for this or any previous visit (from the past 24 hour(s)).   ASSESSMENT: Arthur Ramos is a 27 y.o. male s/p LEG FASCIOTOMY, EXTERNAL FIXATION APPLICATION, CLOSED REDUCTION LEFT TIBIAL PLATEAU FRACTURE by Dr. Blanchie Dessert 01/01/2023 APPLICATION OF WOUND VAC X2  by Dr. Blanchie Dessert 01/01/2023 WOUND VAC CHANGE AND SECONDARY CLOSURE OF LATERAL FASCIOTOMY by Dr. Jena Gauss 01/05/2023 REMOVAL OF EXTERNAL FIXATION WITH ORIF LEFT TIBIAL PLATEAU by Dr. Jena Gauss 01/07/2023 SECONDARY WOUND CLOSURE OF MEDIAL FASCIOTOMY by Dr. Jena Gauss 01/07/2023  CV/Blood loss: Acute blood loss anemia, Hgb 7.0 on 01/12/23. Remains stable   PLAN: Weightbearing: NWB LLE ROM: Okay for unrestricted hip, knee, ankle ROM Incisional  and dressing care: Ok to leave open to air Showering: Okay to allow incisions to get wet Orthopedic device(s): None Pain management:  1. Tylenol 650 mg q 6 hours scheduled 2. Robaxin 500 mg q 6 hours PRN 3. Oxycodone 5-10 mg q 4 hours PRN 4. Toradol 15 mg q 6 hours x 5 doses VTE prophylaxis: Aspirin 325 mg daily. SCDs Foley/Lines:  No foley, KVO IVFs Impediments to Fracture Healing: Vit D level 6, continue home dose supplementation weekly Dispo:  PT/OT as tolerated. Recheck CBC tomorrow. Plan for d/c home once better d/c plan in place, hopefully over next 24-48 hours  Follow - up plan: 2 weeks after discharge for wound check and repeat x-rays   Contact information:  Truitt Merle MD, Thyra Breed PA-C. After hours and holidays please check [email or web address] for group call information for [facility]   Thompson Caul, PA-C [phone] (office) [email or web address]

## 2023-01-13 ENCOUNTER — Encounter (HOSPITAL_COMMUNITY): Payer: Self-pay | Admitting: Student

## 2023-01-13 LAB — CBC
HCT: 22.1 % — ABNORMAL LOW (ref 39.0–52.0)
Hemoglobin: 7.1 g/dL — ABNORMAL LOW (ref 13.0–17.0)
MCH: 27.5 pg (ref 26.0–34.0)
MCHC: 32.1 g/dL (ref 30.0–36.0)
MCV: 85.7 fL (ref 80.0–100.0)
Platelets: 738 10*3/uL — ABNORMAL HIGH (ref 150–400)
RBC: 2.58 MIL/uL — ABNORMAL LOW (ref 4.22–5.81)
RDW: 14.8 % (ref 11.5–15.5)
WBC: 13 10*3/uL — ABNORMAL HIGH (ref 4.0–10.5)
nRBC: 0 % (ref 0.0–0.2)

## 2023-01-13 MED ORDER — ASPIRIN 325 MG PO TABS: 325.0000 mg | ORAL_TABLET | Freq: Every day | ORAL | 0 refills | Status: AC

## 2023-01-13 NOTE — Discharge Instructions (Signed)
[facility]edic [facility] Service Discharge Instructions   General Discharge Instructions  WEIGHT BEARING STATUS:Non-weightbearing left lower extremity  RANGE OF MOTION/ACTIVITY: Ok for hip and knee range of motion as tolerated  Wound Care: Incisions can be left open to air if there is no drainage. Once the incision is completely dry and without drainage, it may be left open to air out.  You are ok to shower and begin getting incisions wet. Clean incision gently with soap and water.  DVT/PE prophylaxis: Aspirin 325 mg daily x 30 days  Diet: as you were eating previously.  Can use over the counter stool softeners and bowel preparations, such as Miralax, to help with bowel movements.  Narcotics can be constipating.  Be sure to drink plenty of fluids  PAIN MEDICATION USE AND EXPECTATIONS  You have likely been given narcotic medications to help control your pain.  After a traumatic event that results in an fracture (broken bone) with or without surgery, it is ok to use narcotic pain medications to help control one's pain.  We understand that everyone responds to pain differently and each individual patient will be evaluated on a regular basis for the continued need for narcotic medications. Ideally, narcotic medication use should last no more than 6-8 weeks (coinciding with fracture healing).   As a patient it is your responsibility as well to monitor narcotic medication use and report the amount and frequency you use these medications when you come to your office visit.   We would also advise that if you are using narcotic medications, you should take a dose prior to therapy to maximize you participation.  IF YOU ARE ON NARCOTIC MEDICATIONS IT IS NOT PERMISSIBLE TO OPERATE A MOTOR VEHICLE (MOTORCYCLE/CAR/TRUCK/MOPED) OR HEAVY MACHINERY DO NOT MIX NARCOTICS WITH OTHER CNS (CENTRAL NERVOUS SYSTEM) DEPRESSANTS SUCH AS ALCOHOL   STOP SMOKING OR USING NICOTINE PRODUCTS!!!!  As discussed nicotine  severely impairs your body's ability to heal surgical and traumatic wounds but also impairs bone healing.  Wounds and bone heal by forming microscopic blood vessels (angiogenesis) and nicotine is a vasoconstrictor (essentially, shrinks blood vessels).  Therefore, if vasoconstriction occurs to these microscopic blood vessels they essentially disappear and are unable to deliver necessary nutrients to the healing tissue.  This is one modifiable factor that you can do to dramatically increase your chances of healing your injury.    (This means no smoking, no nicotine gum, patches, etc)  DO NOT USE NONSTEROIDAL ANTI-INFLAMMATORY DRUGS (NSAID'S)  Using products such as Advil (ibuprofen), Aleve (naproxen), Motrin (ibuprofen) for additional pain control during fracture healing can delay and/or prevent the healing response.  If you would like to take over the counter (OTC) medication, Tylenol (acetaminophen) is ok.  However, some narcotic medications that are given for pain control contain acetaminophen as well. Therefore, you should not exceed more than 4000 mg of tylenol in a day if you do not have liver disease.  Also note that there are may OTC medicines, such as cold medicines and allergy medicines that my contain tylenol as well.  If you have any questions about medications and/or interactions please ask your doctor/PA or your pharmacist.      ICE AND ELEVATE INJURED/OPERATIVE EXTREMITY  Using ice and elevating the injured extremity above your heart can help with swelling and pain control.  Icing in a pulsatile fashion, such as 20 minutes on and 20 minutes off, can be followed.    Do not place ice directly on skin. Make sure there  is a barrier between to skin and the ice pack.    Using frozen items such as frozen peas works well as the conform nicely to the are that needs to be iced.  USE AN ACE WRAP OR TED HOSE FOR SWELLING CONTROL  In addition to icing and elevation, Ace wraps or TED hose are used to  help limit and resolve swelling.  It is recommended to use Ace wraps or TED hose until you are informed to stop.    When using Ace Wraps start the wrapping distally (farthest away from the body) and wrap proximally (closer to the body)   Example: If you had surgery on your leg or thing and you do not have a splint on, start the ace wrap at the toes and work your way up to the thigh        If you had surgery on your upper extremity and do not have a splint on, start the ace wrap at your fingers and work your way up to the upper arm   CALL THE OFFICE FOR MEDICATION REFILLS OR WITH ANY QUESTIONS/CONCERNS: [phone]   VISIT OUR WEBSITE FOR ADDITIONAL INFORMATION: [email or web address]     Discharge Wound Care Instructions  Do NOT apply any ointments, solutions or lotions to pin sites or surgical wounds.  These prevent needed drainage and even though solutions like hydrogen peroxide kill bacteria, they also damage cells lining the pin sites that help fight infection.  Applying lotions or ointments can keep the wounds moist and can cause them to breakdown and open up as well. This can increase the risk for infection. When in doubt call the office.   If any drainage is noted, use one layer of adaptic or Mepitel, then gauze, Kerlix, and an ace wrap. - These dressing supplies should be available at local medical supply stores [facility], [facility], etc) as well as Insurance claims handler ([facility], [facility], [place], etc)  Once the incision is completely dry and without drainage, it may be left open to air out.  Showering may begin 36-48 hours later.  Cleaning gently with soap and water.

## 2023-01-13 NOTE — Progress Notes (Signed)
Orthopaedic Trauma Progress Note  SUBJECTIVE: Doing ok today, notes some pain and tightness in his leg. Feels like the stiches are pulling on his skin some. Just received Toradol about 30 minutes ago. No other issues of note. States he will have a ride this afternoon vs tomorrow morning to go home. Has had some friends re-arrange his room at home to give him more space to mobilize.    OBJECTIVE:  Vitals:   01/12/23 1500 01/12/23 2116  BP: 112/66 113/64  Pulse: (!) 104 89  Resp: 16 17  Temp: 98.3 F (36.8 C) 98.2 F (36.8 C)  SpO2: 100% 100%    General: Sitting up in bed, no acute distress  Respiratory: No increased work of breathing.  Left lower extremity: Incisions clean, dry, intact.No significant tenderness about the knee or throughout the lower leg. Ankle dorsiflexion/plantarflexion intact.  No increase in pain with passive motion of the toes.  Able to actively wiggle the toes.  Endorses sensation over all aspects of the foot. Compartments soft and compressible. + DP pulse  IMAGING: Stable postop imaging  LABS:  Results for orders placed or performed during the hospital encounter of 01/11/23 (from the past 24 hour(s))  CBC     Status: Abnormal   Collection Time: 01/13/23  3:22 AM  Result Value Ref Range   WBC 13.0 (H) 4.0 - 10.5 K/uL   RBC 2.58 (L) 4.22 - 5.81 MIL/uL   Hemoglobin 7.1 (L) 13.0 - 17.0 g/dL   HCT 16.1 (L) 09.6 - 04.5 %   MCV 85.7 80.0 - 100.0 fL   MCH 27.5 26.0 - 34.0 pg   MCHC 32.1 30.0 - 36.0 g/dL   RDW 40.9 81.1 - 91.4 %   Platelets 738 (H) 150 - 400 K/uL   nRBC 0.0 0.0 - 0.2 %     ASSESSMENT: Arthur Ramos is a 27 y.o. male s/p LEG FASCIOTOMY, EXTERNAL FIXATION APPLICATION, CLOSED REDUCTION LEFT TIBIAL PLATEAU FRACTURE by Dr. Blanchie Dessert 01/01/2023 APPLICATION OF WOUND VAC X2  by Dr. Blanchie Dessert 01/01/2023 WOUND VAC CHANGE AND SECONDARY CLOSURE OF LATERAL FASCIOTOMY by Dr. Jena Gauss 01/05/2023 REMOVAL OF EXTERNAL FIXATION WITH ORIF LEFT TIBIAL PLATEAU by Dr.  Jena Gauss 01/07/2023 SECONDARY WOUND CLOSURE OF MEDIAL FASCIOTOMY by Dr. Jena Gauss 01/07/2023  CV/Blood loss: Acute blood loss anemia, Hgb 7.1 on 01/12/23. Remains stable   PLAN: Weightbearing: NWB LLE ROM: Okay for unrestricted hip, knee, ankle ROM Incisional and dressing care: Ok to leave open to air Showering: Okay to allow incisions to get wet Orthopedic device(s): None Pain management:  1. Tylenol 650 mg q 6 hours scheduled 2. Robaxin 500 mg q 6 hours PRN 3. Oxycodone 5-10 mg q 4 hours PRN 4. Toradol 15 mg q 6 hours x 5 doses VTE prophylaxis: Aspirin 325 mg daily. SCDs Foley/Lines:  No foley, KVO IVFs Impediments to Fracture Healing: Vit D level 6, continue home dose supplementation weekly Dispo:  Continue with mobility. D/c home later today vs tomorrow morning, patient working on a ride  Follow - up plan: 2 weeks after discharge for wound check and repeat x-rays   Contact information:  Truitt Merle MD, Thyra Breed PA-C. After hours and holidays please check [email or web address] for group call information for [facility]   Thompson Caul, PA-C [phone] (office) [email or web address]

## 2023-01-13 NOTE — Plan of Care (Addendum)
Patient alert and oriented. IV access removed. Patient educated on discharge. No DME needs.  Patient awaiting volunteer services for transport to lobby for private vehicle transportation home.  Problem: Education: Goal: Knowledge of General Education information will improve Description: Including pain rating scale, medication(s)/side effects and non-pharmacologic comfort measures Outcome: Adequate for Discharge   Problem: Health Behavior/Discharge Planning: Goal: Ability to manage health-related needs will improve Outcome: Adequate for Discharge   Problem: Clinical Measurements: Goal: Ability to maintain clinical measurements within normal limits will improve Outcome: Adequate for Discharge Goal: Will remain free from infection Outcome: Adequate for Discharge Goal: Diagnostic test results will improve Outcome: Adequate for Discharge Goal: Respiratory complications will improve Outcome: Adequate for Discharge Goal: Cardiovascular complication will be avoided Outcome: Adequate for Discharge   Problem: Activity: Goal: Risk for activity intolerance will decrease Outcome: Adequate for Discharge   Problem: Nutrition: Goal: Adequate nutrition will be maintained Outcome: Adequate for Discharge   Problem: Coping: Goal: Level of anxiety will decrease Outcome: Adequate for Discharge   Problem: Elimination: Goal: Will not experience complications related to bowel motility Outcome: Adequate for Discharge Goal: Will not experience complications related to urinary retention Outcome: Adequate for Discharge   Problem: Pain Managment: Goal: General experience of comfort will improve Outcome: Adequate for Discharge   Problem: Safety: Goal: Ability to remain free from injury will improve Outcome: Adequate for Discharge   Problem: Skin Integrity: Goal: Risk for impaired skin integrity will decrease Outcome: Adequate for Discharge

## 2023-01-13 NOTE — TOC Initial Note (Signed)
Transition of Care [facility]) - Initial/Assessment Note    Patient Details  Name: Arthur Ramos MRN: [id number] Date of Birth: March 10, 1996  Transition of Care [facility]) CM/SW Contact:    Lawerance Sabal, RN Phone Number: 01/13/2023, 10:47 AM  Clinical Narrative:                  Spoke to patient at bedside.  H e states that he has all needed DME at home including RW.  He states that he was recently DC'd and understands his follow up appointments.  He has now secured help for himself at home, he declines any TOC assistance.  He states that he currently does not have a wound VAC (I did not remove his covers to assess myself) .  Expected Discharge Plan: Home/Self Care Barriers to Discharge: No Barriers Identified   Patient Goals and CMS Choice Patient states their goals for this hospitalization and ongoing recovery are:: to return home   Choice offered to / list presented to : NA      Expected Discharge Plan and Services   Discharge Planning Services: CM Consult   Living arrangements for the past 2 months: Apartment                                      Prior Living Arrangements/Services Living arrangements for the past 2 months: Apartment Lives with:: Self                   Activities of Daily Living Home Assistive Devices/Equipment: Crutches ADL Screening (condition at time of admission) Patient's cognitive ability adequate to safely complete daily activities?: Yes Is the patient deaf or have difficulty hearing?: No Does the patient have difficulty seeing, even when wearing glasses/contacts?: No Does the patient have difficulty concentrating, remembering, or making decisions?: No Patient able to express need for assistance with ADLs?: No Does the patient have difficulty dressing or bathing?: No Independently performs ADLs?: Yes (appropriate for developmental age) Does the patient have difficulty walking or climbing stairs?: No Weakness of Legs: None Weakness of  Arms/Hands: None  Permission Sought/Granted                  Emotional Assessment              Admission diagnosis:  Leg pain [M79.606] Left leg pain [M79.605] Patient Active Problem List   Diagnosis Date Noted   Leg pain 01/11/2023   Compartment syndrome of lower leg (HCC) 01/01/2023   Closed fracture of medial malleolus 09/05/2019   PCP:  Patient, No Pcp Per Pharmacy:   [id number] [place], [place] - [address] AT [address] [place] [address] Phone: [phone] Fax: [phone]  [facility] [address] [place] [address] Phone: [phone] Fax: [phone]     Social Determinants of Health (SDOH) Social History: SDOH Screenings   Food Insecurity: No Food Insecurity (01/11/2023)  Housing: Low Risk  (01/11/2023)  Transportation Needs: No Transportation Needs (01/11/2023)  Utilities: Not At Risk (01/11/2023)  Tobacco Use: High Risk (01/13/2023)   SDOH Interventions:     Readmission Risk Interventions     No data to display

## 2023-01-13 NOTE — Discharge Summary (Signed)
Orthopaedic Trauma Service (OTS) Discharge Summary   Patient ID: Arthur Ramos MRN: [id number] DOB/AGE: 26-Nov-1995 27 y.o.  Admit date: 01/11/2023 Discharge date: 01/13/2023  Admission Diagnoses:Left tibial plateau fracture   Discharge Diagnoses:  Principal Problem:   Leg pain   Past Medical History:  Diagnosis Date   Anxiety    Bipolar 1 disorder [facility])      Procedures Performed:  None  Discharged Condition: good  Hospital Course: Patient presented to the emergency department on 01/11/2023 after sustaining a fall at home following discharge on 01/10/2023.  Imaging in the emergency department was negative for any fractures or injuries.  Patient admitted to the orthopedic service for safe discharge planning.  Was continued on aspirin for DVT prophylaxis following previous ORIF of left tibial plateau on 01/07/2023.  Patient was evaluated by both occupational physical therapy during this brief hospitalization.  Did well maintaining nonweightbearing on the left lower extremity.  No formal follow-up therapies recommended at time of evaluation. On 01/13/2023, the patient was tolerating diet, working well with therapies, pain well controlled, vital signs stable, dressings clean, dry, intact and felt stable for discharge to home. Patient will follow up as below and knows to call with questions or concerns.     Consults: None  Significant Diagnostic Studies:   Results for orders placed or performed during the hospital encounter of 01/11/23 (from the past 168 hour(s))  CBC with Differential   Collection Time: 01/11/23  1:19 AM  Result Value Ref Range   WBC 14.6 (H) 4.0 - 10.5 K/uL   RBC 2.54 (L) 4.22 - 5.81 MIL/uL   Hemoglobin 7.0 (L) 13.0 - 17.0 g/dL   HCT 78.2 (L) 95.6 - 21.3 %   MCV 88.2 80.0 - 100.0 fL   MCH 27.6 26.0 - 34.0 pg   MCHC 31.3 30.0 - 36.0 g/dL   RDW 08.6 57.8 - 46.9 %   Platelets 627 (H) 150 - 400 K/uL   nRBC 0.0 0.0 - 0.2 %   Neutrophils Relative % 71 %    Neutro Abs 10.5 (H) 1.7 - 7.7 K/uL   Lymphocytes Relative 18 %   Lymphs Abs 2.6 0.7 - 4.0 K/uL   Monocytes Relative 7 %   Monocytes Absolute 1.0 0.1 - 1.0 K/uL   Eosinophils Relative 1 %   Eosinophils Absolute 0.1 0.0 - 0.5 K/uL   Basophils Relative 1 %   Basophils Absolute 0.1 0.0 - 0.1 K/uL   Immature Granulocytes 2 %   Abs Immature Granulocytes 0.30 (H) 0.00 - 0.07 K/uL  Basic metabolic panel   Collection Time: 01/11/23  1:19 AM  Result Value Ref Range   Sodium 136 135 - 145 mmol/L   Potassium 3.7 3.5 - 5.1 mmol/L   Chloride 102 98 - 111 mmol/L   CO2 23 22 - 32 mmol/L   Glucose, Bld 110 (H) 70 - 99 mg/dL   BUN 9 6 - 20 mg/dL   Creatinine, Ser 6.29 0.61 - 1.24 mg/dL   Calcium 8.4 (L) 8.9 - 10.3 mg/dL   GFR, Estimated >52 >84 mL/min   Anion gap 11 5 - 15  CBC   Collection Time: 01/13/23  3:22 AM  Result Value Ref Range   WBC 13.0 (H) 4.0 - 10.5 K/uL   RBC 2.58 (L) 4.22 - 5.81 MIL/uL   Hemoglobin 7.1 (L) 13.0 - 17.0 g/dL   HCT 13.2 (L) 44.0 - 10.2 %   MCV 85.7 80.0 - 100.0 fL   MCH  27.5 26.0 - 34.0 pg   MCHC 32.1 30.0 - 36.0 g/dL   RDW 16.1 09.6 - 04.5 %   Platelets 738 (H) 150 - 400 K/uL   nRBC 0.0 0.0 - 0.2 %  Results for orders placed or performed during the hospital encounter of 01/01/23 (from the past 168 hour(s))  Surgical pcr screen   Collection Time: 01/07/23  5:29 AM   Specimen: Nasal Mucosa; Nasal Swab  Result Value Ref Range   MRSA, PCR NEGATIVE NEGATIVE   Staphylococcus aureus NEGATIVE NEGATIVE  CBC   Collection Time: 01/08/23  6:05 AM  Result Value Ref Range   WBC 14.4 (H) 4.0 - 10.5 K/uL   RBC 2.30 (L) 4.22 - 5.81 MIL/uL   Hemoglobin 6.5 (LL) 13.0 - 17.0 g/dL   HCT 40.9 (L) 81.1 - 91.4 %   MCV 86.1 80.0 - 100.0 fL   MCH 28.3 26.0 - 34.0 pg   MCHC 32.8 30.0 - 36.0 g/dL   RDW 78.2 95.6 - 21.3 %   Platelets 343 150 - 400 K/uL   nRBC 0.0 0.0 - 0.2 %  Basic metabolic panel   Collection Time: 01/08/23  6:05 AM  Result Value Ref Range   Sodium 133  (L) 135 - 145 mmol/L   Potassium 3.6 3.5 - 5.1 mmol/L   Chloride 99 98 - 111 mmol/L   CO2 25 22 - 32 mmol/L   Glucose, Bld 123 (H) 70 - 99 mg/dL   BUN 7 6 - 20 mg/dL   Creatinine, Ser 0.86 0.61 - 1.24 mg/dL   Calcium 8.2 (L) 8.9 - 10.3 mg/dL   GFR, Estimated >57 >84 mL/min   Anion gap 9 5 - 15  CBC   Collection Time: 01/09/23 11:33 AM  Result Value Ref Range   WBC 15.5 (H) 4.0 - 10.5 K/uL   RBC 2.48 (L) 4.22 - 5.81 MIL/uL   Hemoglobin 7.0 (L) 13.0 - 17.0 g/dL   HCT 69.6 (L) 29.5 - 28.4 %   MCV 88.7 80.0 - 100.0 fL   MCH 28.2 26.0 - 34.0 pg   MCHC 31.8 30.0 - 36.0 g/dL   RDW 13.2 44.0 - 10.2 %   Platelets 473 (H) 150 - 400 K/uL   nRBC 0.0 0.0 - 0.2 %  Basic metabolic panel   Collection Time: 01/10/23 12:14 AM  Result Value Ref Range   Sodium 134 (L) 135 - 145 mmol/L   Potassium 3.8 3.5 - 5.1 mmol/L   Chloride 101 98 - 111 mmol/L   CO2 24 22 - 32 mmol/L   Glucose, Bld 108 (H) 70 - 99 mg/dL   BUN 8 6 - 20 mg/dL   Creatinine, Ser 7.25 0.61 - 1.24 mg/dL   Calcium 8.6 (L) 8.9 - 10.3 mg/dL   GFR, Estimated >36 >64 mL/min   Anion gap 9 5 - 15  CBC   Collection Time: 01/10/23 12:14 AM  Result Value Ref Range   WBC 14.9 (H) 4.0 - 10.5 K/uL   RBC 2.47 (L) 4.22 - 5.81 MIL/uL   Hemoglobin 7.0 (L) 13.0 - 17.0 g/dL   HCT 40.3 (L) 47.4 - 25.9 %   MCV 89.1 80.0 - 100.0 fL   MCH 28.3 26.0 - 34.0 pg   MCHC 31.8 30.0 - 36.0 g/dL   RDW 56.3 87.5 - 64.3 %   Platelets 518 (H) 150 - 400 K/uL   nRBC 0.0 0.0 - 0.2 %     Treatments: IV hydration, analgesia: acetaminophen,  Toradol, and oxycodone, anticoagulation: ASA, and therapies: PT and OT  Discharge Exam:  General: Sitting up in bed, no acute distress  Respiratory: No increased work of breathing.  Left lower extremity: Incisions clean, dry, intact.No significant tenderness about the knee or throughout the lower leg. Ankle dorsiflexion/plantarflexion intact.  No increase in pain with passive motion of the toes.  Able to actively  wiggle the toes.  Endorses sensation over all aspects of the foot. Compartments soft and compressible. + DP pulse   Disposition: Discharge disposition: 01-Home or Self Care       Discharge Instructions     Call MD / Call 911   Complete by: As directed    If you experience chest pain or shortness of breath, CALL 911 and be transported to the hospital emergency room.  If you develope a fever above 101 F, pus (white drainage) or increased drainage or redness at the wound, or calf pain, call your surgeon's office.   Constipation Prevention   Complete by: As directed    Drink plenty of fluids.  Prune juice may be helpful.  You may use a stool softener, such as Colace (over the counter) 100 mg twice a day.  Use MiraLax (over the counter) for constipation as needed.   Diet - low sodium heart healthy   Complete by: As directed    Increase activity slowly as tolerated   Complete by: As directed    Post-operative opioid taper instructions:   Complete by: As directed    POST-OPERATIVE OPIOID TAPER INSTRUCTIONS: It is important to wean off of your opioid medication as soon as possible. If you do not need pain medication after your surgery it is ok to stop day one. Opioids include: Codeine, Hydrocodone(Norco, Vicodin), Oxycodone(Percocet, oxycontin) and hydromorphone amongst others.  Long term and even short term use of opiods can cause: Increased pain response Dependence Constipation Depression Respiratory depression And more.  Withdrawal symptoms can include Flu like symptoms Nausea, vomiting And more Techniques to manage these symptoms Hydrate well Eat regular healthy meals Stay active Use relaxation techniques(deep breathing, meditating, yoga) Do Not substitute Alcohol to help with tapering If you have been on opioids for less than two weeks and do not have pain than it is ok to stop all together.  Plan to wean off of opioids This plan should start within one week post op of  your joint replacement. Maintain the same interval or time between taking each dose and first decrease the dose.  Cut the total daily intake of opioids by one tablet each day Next start to increase the time between doses. The last dose that should be eliminated is the evening dose.         Allergies as of 01/13/2023   No Known Allergies      Medication List     TAKE these medications    acetaminophen 500 MG tablet Commonly known as: TYLENOL Take 2 tablets (1,000 mg total) by mouth every 6 (six) hours as needed for mild pain, fever or headache.   aspirin 325 MG tablet Take 1 tablet (325 mg total) by mouth daily. Start taking on: Jan 14, 2023   ferrous sulfate 325 (65 FE) MG tablet Take 1 tablet (325 mg total) by mouth daily with breakfast for 7 days.   methocarbamol 500 MG tablet Commonly known as: ROBAXIN Take 1 tablet (500 mg total) by mouth every 6 (six) hours as needed for muscle spasms.   Oxycodone HCl 10 MG Tabs  Take 1 tablet (10 mg total) by mouth every 4 (four) hours as needed for severe pain.   Vitamin D (Ergocalciferol) 1.25 MG (50000 UNIT) Caps capsule Commonly known as: DRISDOL Take 1 capsule (50,000 Units total) by mouth every 7 (seven) days.        Follow-up Information     Haddix, Gillie Manners, MD. Schedule an appointment as soon as possible for a visit.   Specialty: Orthopedic Surgery Why: 1-2 weeks for wound check and repeat x-rays Contact information: [address] [place] [address] [phone]                 Discharge Instructions and Plan: Patient will be discharged to home. Will be discharged on Aspirin for DVT prophylaxis. Patient has been provided with all the necessary DME for discharge. Patient will follow up with Dr. Jena Gauss in 1-2 weeks for repeat x-rays and suture removal.   Signed:  Thompson Caul, PA-C ?([phone]? (phone) 01/13/2023, 3:03 PM  [facility] [address] [place]  [place] [address] [phone] Collier Bullock (F)

## 2023-01-22 NOTE — Anesthesia Postprocedure Evaluation (Signed)
Anesthesia Post Note  Patient: Arthur Ramos  Procedure(s) Performed: LEG FASCIOTOMY, EXTERNAL FIXATION APPLICATION CLOSED REDUCTION (Left: Leg Lower) APPLICATION OF WOUND VAC X2 (Left: Leg Lower)     Patient location during evaluation: PACU Anesthesia Type: General Level of consciousness: awake and sedated Pain management: pain level controlled Vital Signs Assessment: post-procedure vital signs reviewed and stable Respiratory status: spontaneous breathing Cardiovascular status: stable Postop Assessment: no apparent nausea or vomiting Anesthetic complications: no  No notable events documented.  Last Vitals:  Vitals:   01/10/23 0621 01/10/23 0750  BP: 138/83 133/84  Pulse: (!) 105 (!) 103  Resp: 19 18  Temp: 36.8 C 36.7 C  SpO2: 100% 100%    Last Pain:  Vitals:   01/10/23 1236  TempSrc:   PainSc: 10-Worst pain ever                 Caren Macadam

## 2023-01-23 ENCOUNTER — Emergency Department (HOSPITAL_COMMUNITY)
Admission: EM | Admit: 2023-01-23 | Discharge: 2023-01-23 | Disposition: A | Discharge status: Home / Self Care | Payer: Self-pay | Attending: Emergency Medicine | Admitting: Emergency Medicine

## 2023-01-23 ENCOUNTER — Other Ambulatory Visit: Payer: Self-pay

## 2023-01-23 ENCOUNTER — Encounter (HOSPITAL_COMMUNITY): Payer: Self-pay

## 2023-01-23 ENCOUNTER — Ambulatory Visit (HOSPITAL_COMMUNITY)
Admission: EM | Admit: 2023-01-23 | Discharge: 2023-01-23 | Disposition: A | Discharge status: Home / Self Care | Payer: Self-pay

## 2023-01-23 ENCOUNTER — Emergency Department (HOSPITAL_BASED_OUTPATIENT_CLINIC_OR_DEPARTMENT_OTHER): Payer: Self-pay

## 2023-01-23 DIAGNOSIS — L089 Local infection of the skin and subcutaneous tissue, unspecified: Secondary | ICD-10-CM | POA: Diagnosis not present

## 2023-01-23 DIAGNOSIS — Z7982 Long term (current) use of aspirin: Secondary | ICD-10-CM | POA: Diagnosis not present

## 2023-01-23 DIAGNOSIS — R609 Edema, unspecified: Secondary | ICD-10-CM

## 2023-01-23 DIAGNOSIS — R2242 Localized swelling, mass and lump, left lower limb: Secondary | ICD-10-CM | POA: Diagnosis present

## 2023-01-23 DIAGNOSIS — M7989 Other specified soft tissue disorders: Secondary | ICD-10-CM

## 2023-01-23 LAB — CBC WITH DIFFERENTIAL/PLATELET
Abs Immature Granulocytes: 0.04 10*3/uL (ref 0.00–0.07)
Basophils Absolute: 0.1 10*3/uL (ref 0.0–0.1)
Basophils Relative: 1 %
Eosinophils Absolute: 0.2 10*3/uL (ref 0.0–0.5)
Eosinophils Relative: 2 %
HCT: 28.7 % — ABNORMAL LOW (ref 39.0–52.0)
Hemoglobin: 9 g/dL — ABNORMAL LOW (ref 13.0–17.0)
Immature Granulocytes: 0 %
Lymphocytes Relative: 19 %
Lymphs Abs: 2.2 10*3/uL (ref 0.7–4.0)
MCH: 26.9 pg (ref 26.0–34.0)
MCHC: 31.4 g/dL (ref 30.0–36.0)
MCV: 85.7 fL (ref 80.0–100.0)
Monocytes Absolute: 1.1 10*3/uL — ABNORMAL HIGH (ref 0.1–1.0)
Monocytes Relative: 10 %
Neutro Abs: 7.9 10*3/uL — ABNORMAL HIGH (ref 1.7–7.7)
Neutrophils Relative %: 68 %
Platelets: 759 10*3/uL — ABNORMAL HIGH (ref 150–400)
RBC: 3.35 MIL/uL — ABNORMAL LOW (ref 4.22–5.81)
RDW: 14.5 % (ref 11.5–15.5)
WBC: 11.6 10*3/uL — ABNORMAL HIGH (ref 4.0–10.5)
nRBC: 0 % (ref 0.0–0.2)

## 2023-01-23 LAB — COMPREHENSIVE METABOLIC PANEL WITH GFR
ALT: 19 U/L (ref 0–44)
AST: 24 U/L (ref 15–41)
Albumin: 3.6 g/dL (ref 3.5–5.0)
Alkaline Phosphatase: 88 U/L (ref 38–126)
Anion gap: 8 (ref 5–15)
BUN: 10 mg/dL (ref 6–20)
CO2: 26 mmol/L (ref 22–32)
Calcium: 8.9 mg/dL (ref 8.9–10.3)
Chloride: 98 mmol/L (ref 98–111)
Creatinine, Ser: 0.9 mg/dL (ref 0.61–1.24)
GFR, Estimated: >60 mL/min
Glucose, Bld: 127 mg/dL — ABNORMAL HIGH (ref 70–99)
Potassium: 3.6 mmol/L (ref 3.5–5.1)
Sodium: 132 mmol/L — ABNORMAL LOW (ref 135–145)
Total Bilirubin: 0.3 mg/dL (ref 0.3–1.2)
Total Protein: 7.3 g/dL (ref 6.5–8.1)

## 2023-01-23 LAB — LACTIC ACID, PLASMA: Lactic Acid, Venous: 0.9 mmol/L (ref 0.5–1.9)

## 2023-01-23 MED ORDER — FENTANYL CITRATE PF 50 MCG/ML IJ SOSY
25.0000 ug | PREFILLED_SYRINGE | Freq: Once | INTRAMUSCULAR | Status: AC
  Administered 2023-01-23: 25 ug via INTRAVENOUS
  Filled 2023-01-23: qty 1

## 2023-01-23 MED ORDER — CEFADROXIL 500 MG PO CAPS: 500.0000 mg | ORAL_CAPSULE | Freq: Two times a day (BID) | ORAL | 0 refills | Status: AC

## 2023-01-23 MED ORDER — ONDANSETRON HCL 4 MG/2ML IJ SOLN
4.0000 mg | Freq: Once | INTRAMUSCULAR | Status: AC
  Administered 2023-01-23: 4 mg via INTRAVENOUS
  Filled 2023-01-23: qty 2

## 2023-01-23 MED ORDER — CEFADROXIL 500 MG PO CAPS
500.0000 mg | ORAL_CAPSULE | Freq: Once | ORAL | Status: AC
  Administered 2023-01-23: 500 mg via ORAL
  Filled 2023-01-23: qty 1

## 2023-01-23 NOTE — Discharge Instructions (Addendum)
You have a wound infection where you start having some drainage.  You have been started on antibiotics.  I sent in Duricef.  Take this for the next 10 days.  Follow-up with Dr. Jena Gauss on Monday.  If you have any worsening symptoms please return to the emergency room.

## 2023-01-23 NOTE — ED Notes (Signed)
Spoke with providers on staff and they advised pt should be seen in ED for possible ultrasound considering his recent ultrasound. Pt advised and he verbalized understanding and states he will go directly to ED

## 2023-01-23 NOTE — ED Provider Notes (Signed)
[place] EMERGENCY DEPARTMENT AT [facility] Provider Note   CSN: [id number] Arrival date & time: 01/23/23  1511     History  Chief Complaint  Patient presents with   Leg Swelling    Arthur Ramos is a 27 y.o. male.  27 year old male presents today for concern of left leg pain, swelling, and drainage from the incision sites.  He is status post ORIF of left tibial plateau on 01/07/2023.  He has follow-up coming up on Monday with the orthopedic surgeon.  Symptoms started 3 days ago.  He states the swelling is new since this morning.  He was taken aspirin as a DVT prophylaxis but has not been compliant with this because he was unsure that this is something he needed to get over-the-counter.  He denies fever.  He does endorse some night sweats but he attributes this to stopping his narcotic medication suddenly so he does not become dependent on it.  The history is provided by the patient. No language interpreter was used.       Home Medications Prior to Admission medications   Medication Sig Start Date End Date Taking? Authorizing Provider  acetaminophen (TYLENOL) 500 MG tablet Take 2 tablets (1,000 mg total) by mouth every 6 (six) hours as needed for mild pain, fever or headache. Patient not taking: Reported on 01/11/2023 01/09/23   West Bali, PA-C  aspirin 325 MG tablet Take 1 tablet (325 mg total) by mouth daily. 01/14/23 02/13/23  West Bali, PA-C  ferrous sulfate 325 (65 FE) MG tablet Take 1 tablet (325 mg total) by mouth daily with breakfast for 7 days. Patient not taking: Reported on 01/11/2023 01/09/23 01/16/23  West Bali, PA-C  methocarbamol (ROBAXIN) 500 MG tablet Take 1 tablet (500 mg total) by mouth every 6 (six) hours as needed for muscle spasms. Patient not taking: Reported on 01/11/2023 01/09/23   West Bali, PA-C  oxyCODONE 10 MG TABS Take 1 tablet (10 mg total) by mouth every 4 (four) hours as needed for severe pain. Patient not taking:  Reported on 01/11/2023 01/09/23   West Bali, PA-C  Vitamin D, Ergocalciferol, (DRISDOL) 1.25 MG (50000 UNIT) CAPS capsule Take 1 capsule (50,000 Units total) by mouth every 7 (seven) days. Patient not taking: Reported on 01/11/2023 01/13/23   West Bali, PA-C      Allergies    Patient has no known allergies.    Review of Systems   Review of Systems  Constitutional:  Negative for fever.  Respiratory:  Negative for shortness of breath.   Cardiovascular:  Positive for leg swelling. Negative for chest pain.  Skin:  Positive for wound.  All other systems reviewed and are negative.   Physical Exam Updated Vital Signs BP (!) 143/82 (BP Location: Left Arm)   Pulse (!) 120   Temp 98.8 F (37.1 C) (Oral)   Resp 18   Ht 5\' 9"  (1.753 m)   Wt 72.6 kg   SpO2 96%   BMI 23.63 kg/m  Physical Exam Vitals and nursing note reviewed.  Constitutional:      General: He is not in acute distress.    Appearance: Normal appearance. He is not ill-appearing.  HENT:     Head: Normocephalic and atraumatic.     Nose: Nose normal.  Eyes:     Conjunctiva/sclera: Conjunctivae normal.  Pulmonary:     Effort: Pulmonary effort is normal. No respiratory distress.  Musculoskeletal:  General: No deformity.  Skin:    Findings: No rash.     Comments: See attached image for description of incision site.  Left lateral incision does have a little bit of evidence of drainage but it is not purulent at this time.  Associated erythema and warmth noted.  Neurological:     Mental Status: He is alert.     ED Results / Procedures / Treatments   Labs (all labs ordered are listed, but only abnormal results are displayed) Labs Reviewed  LACTIC ACID, PLASMA  LACTIC ACID, PLASMA  COMPREHENSIVE METABOLIC PANEL  CBC WITH DIFFERENTIAL/PLATELET    EKG None  Radiology No results found.  Procedures Procedures    Medications Ordered in ED Medications  ondansetron (ZOFRAN) injection 4 mg (has no  administration in time range)  fentaNYL (SUBLIMAZE) injection 25 mcg (has no administration in time range)    ED Course/ Medical Decision Making/ A&P                             Medical Decision Making Amount and/or Complexity of Data Reviewed Labs: ordered.  Risk Prescription drug management.   27 year old male presents today for concern of leg pain.  He is recently postop on 5/15.  He states drainage and pain started 3 days ago.  Swelling is new today.  He has not been taking his aspirin for his DVT prophylaxis.  He denies chest pain, shortness of breath.  Area on the left lateral leg does show evidence of mild amount of drainage.  No active drainage.  The pillow he is resting his legs on does have stains from the drainage.  Discussed with Dr. Costella Hatcher who is covering for Dr. Jena Gauss who recommends Duricef for 10 days, and outlined the borders of any erythema.  There is some warmth to the leg but no definite erythema.  He will follow-up with Dr. Jena Gauss on Monday.  Strict return precautions given.  DVT study obtained and negative.  Patient is in agreement with plan.  Discussed resuming his aspirin as he was directed.   Final Clinical Impression(s) / ED Diagnoses Final diagnoses:  Leg swelling  Wound infection    Rx / DC Orders ED Discharge Orders          Ordered    cefadroxil (DURICEF) 500 MG capsule  2 times daily        01/23/23 1937              Marita Kansas, PA-C 01/23/23 1938    Terald Sleeper, MD 01/23/23 2114

## 2023-01-23 NOTE — Progress Notes (Signed)
Left lower extremity venous study completed.   Preliminary results relayed to PA.  Please see CV Procedures for preliminary results.  Everard Interrante, RVT  7:10 PM 01/23/23

## 2023-01-23 NOTE — ED Notes (Signed)
Pt given turkey sandwich and gingerale. 
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
## 2023-01-29 ENCOUNTER — Inpatient Hospital Stay: Payer: Self-pay | Admitting: Family

## 2023-02-04 ENCOUNTER — Ambulatory Visit: Payer: Self-pay | Admitting: *Deleted

## 2023-02-04 NOTE — Telephone Encounter (Signed)
  Chief Complaint: Left knee pain Symptoms: ORIF Left Tib fx 01/01/23. States "Working out left knee, exercising." Mild redness to knee itself.  Frequency: 01/01/23 Pertinent Negatives: Patient denies  Disposition: [] ED /[] Urgent Care (no appt availability in office) / [] Appointment(In office/virtual)/ []  [facility]/ [] Home Care/ [] Refused Recommended Disposition /[x] [facility]/ []  Follow-up with PCP Additional Notes: Pt placed me on hold for extended period of time. Back on, may have placed me on hold again, did not respond to me. States he saw Amy 01/26/23, no record of visit noted. States calling for pain meds, "Only 2 left." [facility], unsure if pt heard recommendation.  Called pt back, able to reach. States he saw Amy 01/26/23. Requesting refill pain meds, Oxy. Noted pt saw Thyra Breed, PA. With Ortho. States "No I saw Amy, never saw the Ortho doctors." Declined ED, declined [facility]. Please advise.   Reason for Disposition  [1] Redness of the skin AND [2] no fever  Answer Assessment - Initial Assessment Questions 1. LOCATION and RADIATION: "Where is the pain located?"      Back of left knee 2. QUALITY: "What does the pain feel like?"  (e.g., sharp, dull, aching, burning)      3. SEVERITY: "How bad is the pain?" "What does it keep you from doing?"   (Scale 1-10; or mild, moderate, severe)   -  MILD (1-3): doesn't interfere with normal activities    -  MODERATE (4-7): interferes with normal activities (e.g., work or school) or awakens from sleep, limping    -  SEVERE (8-10): excruciating pain, unable to do any normal activities, unable to walk     10/10   with meds 7/10 4. ONSET: "When did the pain start?" "Does it come and go, or is it there all the time?"     Saturday "Working on it" 5. RECURRENT: "Have you had this pain before?" If Yes, ask: "When, and what happened then?"      6. SETTING: "Has there been any recent work, exercise or other activity  that involved that part of the body?"      Surgery 7. AGGRAVATING FACTORS: "What makes the knee pain worse?" (e.g., walking, climbing stairs, running)     Exercise 8. ASSOCIATED SYMPTOMS: "Is there any swelling or redness of the knee?"     Knee is red and swollen 9. OTHER SYMPTOMS: "Do you have any other symptoms?" (e.g., chest pain, difficulty breathing, fever, calf pain)     No  Protocols used: Knee Pain-A-AH

## 2023-02-05 ENCOUNTER — Ambulatory Visit: Payer: Self-pay
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
## 2023-03-01 ENCOUNTER — Emergency Department (HOSPITAL_COMMUNITY): Payer: Self-pay

## 2023-03-01 ENCOUNTER — Other Ambulatory Visit: Payer: Self-pay

## 2023-03-01 ENCOUNTER — Emergency Department (HOSPITAL_COMMUNITY)
Admission: EM | Admit: 2023-03-01 | Discharge: 2023-03-01 | Disposition: A | Discharge status: Home / Self Care | Payer: Self-pay | Attending: Emergency Medicine | Admitting: Emergency Medicine

## 2023-03-01 DIAGNOSIS — M25562 Pain in left knee: Secondary | ICD-10-CM | POA: Insufficient documentation

## 2023-03-01 DIAGNOSIS — Z96652 Presence of left artificial knee joint: Secondary | ICD-10-CM | POA: Insufficient documentation

## 2023-03-01 NOTE — Discharge Instructions (Signed)
X-ray did not have any new injuries or show complications from your recent knee replacement. Follow up with your orthopedist for continued symptoms or concerns. Return to ED for new injuries or worsening symptoms.

## 2023-03-01 NOTE — ED Triage Notes (Signed)
Pt states that he had left knee replacement x 1 month ago and yesterday he was playing with his kids and his daughter ran into his left leg and now he has increased pain in that knee.

## 2023-03-01 NOTE — ED Provider Notes (Signed)
[place] EMERGENCY DEPARTMENT AT [facility] Provider Note   CSN: [id number] Arrival date & time: 03/01/23  0447     History  Chief Complaint  Patient presents with   Knee Pain    Arthur Ramos is a 27 y.o. male with PMH anxiety and bipolar disorder who presents to ED c/o L knee pain after child ran into it yesterday. Pt status post left knee replacement and fasciotomy approximately 1 month ago after sustaining tibial plateau fracture with subsequent compartment syndrome.  Per chart review, patient is taking oxycodone 10 mg at home and is followed by Dr. Jena Gauss with orthopedics. History limited as on multiple assessments pt states "go away."      Home Medications Prior to Admission medications   Medication Sig Start Date End Date Taking? Authorizing Provider  acetaminophen (TYLENOL) 500 MG tablet Take 2 tablets (1,000 mg total) by mouth every 6 (six) hours as needed for mild pain, fever or headache. Patient not taking: Reported on 01/11/2023 01/09/23   West Bali, PA-C  ferrous sulfate 325 (65 FE) MG tablet Take 1 tablet (325 mg total) by mouth daily with breakfast for 7 days. Patient not taking: Reported on 01/11/2023 01/09/23 01/16/23  West Bali, PA-C  methocarbamol (ROBAXIN) 500 MG tablet Take 1 tablet (500 mg total) by mouth every 6 (six) hours as needed for muscle spasms. Patient not taking: Reported on 01/11/2023 01/09/23   West Bali, PA-C  oxyCODONE 10 MG TABS Take 1 tablet (10 mg total) by mouth every 4 (four) hours as needed for severe pain. Patient not taking: Reported on 01/11/2023 01/09/23   West Bali, PA-C  Vitamin D, Ergocalciferol, (DRISDOL) 1.25 MG (50000 UNIT) CAPS capsule Take 1 capsule (50,000 Units total) by mouth every 7 (seven) days. Patient not taking: Reported on 01/11/2023 01/13/23   West Bali, PA-C      Allergies    Patient has no known allergies.    Review of Systems   Review of Systems  Unable to perform ROS:  Other  Patient uncooperative.   Physical Exam Updated Vital Signs BP (!) 124/91 (BP Location: Left Arm)   Pulse 96   Temp 97.8 F (36.6 C) (Oral)   Resp 17   Ht 5\' 9"  (1.753 m)   Wt 74.4 kg   SpO2 100%   BMI 24.22 kg/m  Physical Exam Vitals and nursing note reviewed.  Constitutional:      General: He is not in acute distress.    Comments: Sleeping comfortably  HENT:     Head: Normocephalic and atraumatic.     Mouth/Throat:     Mouth: Mucous membranes are moist.  Cardiovascular:     Rate and Rhythm: Normal rate and regular rhythm.  Pulmonary:     Effort: Pulmonary effort is normal.     Breath sounds: Normal breath sounds.  Abdominal:     General: Abdomen is flat.     Palpations: Abdomen is soft.  Musculoskeletal:     Comments: L knee with healing surgical scars, no surrounding erythema or increased warmth, no drainage from scars, no ecchymosis or acute wounds noted, knee joint stable, 2+ DP and PT pulses, soft compartments, no calf tenderness, no tenderness to distal lower extremity  Skin:    General: Skin is warm.     Capillary Refill: Capillary refill takes less than 2 seconds.  Neurological:     Motor: Weakness: sleeping, will arouse to verbal stimuli, make eye contact then  proceeds to go back to sleep.  Psychiatric:        Behavior: Behavior is uncooperative.     ED Results / Procedures / Treatments   Labs (all labs ordered are listed, but only abnormal results are displayed) Labs Reviewed - No data to display  EKG None  Radiology DG Knee Complete 4 Views Left  Result Date: 03/01/2023 CLINICAL DATA:  Fall.  Recent knee surgery EXAM: LEFT KNEE - COMPLETE 4 VIEW COMPARISON:  01/11/2023 FINDINGS: Tibial plateau fracture with fixation hardware in stable position. Generalized osteopenia. Fracture alignment including at the tibial eminence is unchanged. Disuse osteopenia. Resolved knee joint effusion. IMPRESSION: Healing tibial plateau fracture with hardware. No new  or acute finding. Electronically Signed   By: Tiburcio Pea M.D.   On: 03/01/2023 06:29    Procedures Procedures    Medications Ordered in ED Medications - No data to display  ED Course/ Medical Decision Making/ A&P                             Medical Decision Making Amount and/or Complexity of Data Reviewed Radiology: ordered. Decision-making details documented in ED Course.   Medical Decision Making:   Arthur Ramos is a 27 y.o. male who presented to the ED today with L knee pain detailed above.    Patient's presentation is complicated by their history of recent knee replacement, compartment syndrome.  Complete initial physical exam performed, notably the patient was in NAD. Well healing surgical wounds to L knee, no signs of infection, knee joint appears stable, NVID.    Reviewed and confirmed nursing documentation for past medical history, family history, social history.    Initial Assessment:   With the patient's presentation, differential diagnosis includes but is not limited to sprain, strain, fracture, dislocation, surgical complication, cellulitis, contusion, compartment syndrome.   This is most consistent with an acute complicated illness  Initial Plan:  XR to evaluate for bony pathology Objective evaluation as below reviewed   Initial Study Results:   Radiology:  All images reviewed independently. Agree with radiology report at this time.   DG Knee Complete 4 Views Left  Result Date: 03/01/2023 CLINICAL DATA:  Fall.  Recent knee surgery EXAM: LEFT KNEE - COMPLETE 4 VIEW COMPARISON:  01/11/2023 FINDINGS: Tibial plateau fracture with fixation hardware in stable position. Generalized osteopenia. Fracture alignment including at the tibial eminence is unchanged. Disuse osteopenia. Resolved knee joint effusion. IMPRESSION: Healing tibial plateau fracture with hardware. No new or acute finding. Electronically Signed   By: Tiburcio Pea M.D.   On: 03/01/2023 06:29       Final Assessment and Plan:   27 year old male status post L knee replacement, fasciotomy following tibial plateau fracture presents to ED c/o acute L knee pain following injury yesterday. Exam reassuring, joint stable, compartments soft, neurovascularly intact distally. XR with no acute findings. History limited as pt uncooperative. No additional complaints voiced. Pain medication prescribed for home by outpatient team. Will have follow up with orthopedics. Strict ED return precautions provided and pt stable for discharge.     Clinical Impression:  1. Acute pain of left knee      Discharge           Final Clinical Impression(s) / ED Diagnoses Final diagnoses:  Acute pain of left knee    Rx / DC Orders ED Discharge Orders     None  Richardson Dopp 03/01/23 1610    Jacalyn Lefevre, MD 03/01/23 [phone]

## 2023-12-30 ENCOUNTER — Other Ambulatory Visit: Payer: Self-pay

## 2023-12-30 ENCOUNTER — Emergency Department (HOSPITAL_COMMUNITY)
Admission: EM | Admit: 2023-12-30 | Discharge: 2023-12-31 | Disposition: A | Discharge status: Home / Self Care | Payer: Self-pay | Attending: Emergency Medicine | Admitting: Emergency Medicine

## 2023-12-30 ENCOUNTER — Encounter (HOSPITAL_COMMUNITY): Payer: Self-pay | Admitting: *Deleted

## 2023-12-30 DIAGNOSIS — Z202 Contact with and (suspected) exposure to infections with a predominantly sexual mode of transmission: Secondary | ICD-10-CM | POA: Insufficient documentation

## 2023-12-30 LAB — URINALYSIS, ROUTINE W REFLEX MICROSCOPIC
Bacteria, UA: NONE SEEN
Bilirubin Urine: NEGATIVE
Glucose, UA: NEGATIVE mg/dL
Ketones, ur: NEGATIVE mg/dL
Nitrite: NEGATIVE
Protein, ur: NEGATIVE mg/dL
Specific Gravity, Urine: 1.023 (ref 1.005–1.030)
WBC, UA: >50 WBC/hpf (ref 0–5)
pH: 7 (ref 5.0–8.0)

## 2023-12-30 NOTE — ED Triage Notes (Signed)
 Just noticed from his penis yesterday  no temp

## 2023-12-31 LAB — RPR: RPR Ser Ql: NONREACTIVE

## 2023-12-31 LAB — RAPID HIV SCREEN (HIV 1/2 AB+AG)
HIV 1/2 Antibodies: NONREACTIVE
HIV-1 P24 Antigen - HIV24: NONREACTIVE

## 2023-12-31 LAB — GC/CHLAMYDIA PROBE AMP (~~LOC~~) NOT AT ARMC
Chlamydia: NEGATIVE
Comment: NEGATIVE
Comment: NORMAL
Neisseria Gonorrhea: POSITIVE — AB

## 2023-12-31 MED ORDER — DOXYCYCLINE HYCLATE 100 MG PO TABS
100.0000 mg | ORAL_TABLET | Freq: Two times a day (BID) | ORAL | 0 refills | Status: AC
Start: 2023-12-31 — End: ?

## 2023-12-31 MED ORDER — DOXYCYCLINE HYCLATE 100 MG PO TABS
100.0000 mg | ORAL_TABLET | Freq: Once | ORAL | Status: AC
  Administered 2023-12-31: 100 mg via ORAL
  Filled 2023-12-31: qty 1

## 2023-12-31 MED ORDER — LIDOCAINE HCL (PF) 1 % IJ SOLN
1.0000 mL | Freq: Once | INTRAMUSCULAR | Status: AC
  Administered 2023-12-31: 1 mL
  Filled 2023-12-31: qty 5

## 2023-12-31 MED ORDER — CEFTRIAXONE SODIUM 500 MG IJ SOLR
500.0000 mg | Freq: Once | INTRAMUSCULAR | Status: AC
  Administered 2023-12-31: 500 mg via INTRAMUSCULAR
  Filled 2023-12-31: qty 500

## 2023-12-31 NOTE — Discharge Instructions (Signed)
 You may follow on MyChart for results of your STI testing.  If the gonorrhea/chlamydia test is positive you will need to take the full course of doxycycline.  If it is negative you may discontinue the medication.  You should refrain from sexual intercourse until the full course of antibiotics is completed.  If your HIV or RPR test are positive you may need further treatment.

## 2023-12-31 NOTE — ED Provider Notes (Signed)
 [place] EMERGENCY DEPARTMENT AT [facility] Provider Note   CSN: [id number] Arrival date & time: 12/30/23  2216     History  Chief Complaint  Patient presents with   need private area checked    Arthur Ramos is a 28 y.o. male.  Patient presents to the emergency department complaining of penile discharge that began yesterday.  He endorses being sexually active with 1 partner.  He has advised that partner to get tested for STIs as well.  Patient denies dysuria but endorses a whitish colored discharge.  No other complaints at this time.  HPI     Home Medications Prior to Admission medications   Medication Sig Start Date End Date Taking? Authorizing Provider  doxycycline (VIBRA-TABS) 100 MG tablet Take 1 tablet (100 mg total) by mouth 2 (two) times daily. 12/31/23  Yes Elisa Guest, PA-C  acetaminophen  (TYLENOL ) 500 MG tablet Take 2 tablets (1,000 mg total) by mouth every 6 (six) hours as needed for mild pain, fever or headache. Patient not taking: Reported on 01/11/2023 01/09/23   Versie Gores, PA-C  ferrous sulfate  325 (65 FE) MG tablet Take 1 tablet (325 mg total) by mouth daily with breakfast for 7 days. Patient not taking: Reported on 01/11/2023 01/09/23 01/16/23  Versie Gores, PA-C  methocarbamol  (ROBAXIN ) 500 MG tablet Take 1 tablet (500 mg total) by mouth every 6 (six) hours as needed for muscle spasms. Patient not taking: Reported on 01/11/2023 01/09/23   Versie Gores, PA-C  oxyCODONE  10 MG TABS Take 1 tablet (10 mg total) by mouth every 4 (four) hours as needed for severe pain. Patient not taking: Reported on 01/11/2023 01/09/23   Versie Gores, PA-C  Vitamin D , Ergocalciferol , (DRISDOL ) 1.25 MG (50000 UNIT) CAPS capsule Take 1 capsule (50,000 Units total) by mouth every 7 (seven) days. Patient not taking: Reported on 01/11/2023 01/13/23   Versie Gores, PA-C      Allergies    Patient has no known allergies.    Review of Systems   Review of  Systems  Physical Exam Updated Vital Signs BP (!) 137/93 (BP Location: Right Arm)   Pulse 65   Temp 98 F (36.7 C) (Oral)   Resp 16   Ht 5\' 9"  (1.753 m)   Wt 74.4 kg   SpO2 100%   BMI 24.22 kg/m  Physical Exam Vitals and nursing note reviewed.  HENT:     Head: Normocephalic and atraumatic.  Eyes:     Conjunctiva/sclera: Conjunctivae normal.  Pulmonary:     Effort: Pulmonary effort is normal. No respiratory distress.  Genitourinary:    Comments: Deferred Musculoskeletal:        General: No signs of injury.     Cervical back: Normal range of motion.  Skin:    General: Skin is dry.  Neurological:     Mental Status: He is alert.  Psychiatric:        Speech: Speech normal.        Behavior: Behavior normal.     ED Results / Procedures / Treatments   Labs (all labs ordered are listed, but only abnormal results are displayed) Labs Reviewed  URINALYSIS, ROUTINE W REFLEX MICROSCOPIC - Abnormal; Notable for the following components:      Result Value   APPearance HAZY (*)    Hgb urine dipstick SMALL (*)    Leukocytes,Ua LARGE (*)    All other components within normal limits  RAPID HIV SCREEN (HIV  1/2 AB+AG)  RPR  GC/CHLAMYDIA PROBE AMP ([place]) NOT AT [facility]    EKG None  Radiology No results found.  Procedures Procedures    Medications Ordered in ED Medications  cefTRIAXone  (ROCEPHIN ) injection 500 mg (500 mg Intramuscular Given 12/31/23 0400)  lidocaine  (PF) (XYLOCAINE ) 1 % injection 1-2.1 mL (1 mL Other Given 12/31/23 0400)  doxycycline (VIBRA-TABS) tablet 100 mg (100 mg Oral Given 12/31/23 0402)    ED Course/ Medical Decision Making/ A&P                                 Medical Decision Making Amount and/or Complexity of Data Reviewed Labs: ordered.  Risk Prescription drug management.   This patient presents to the ED for concern of penile discharge, this involves an extensive number of treatment options, and is a complaint that carries with it a  high risk of complications and morbidity.    Lab Tests:  I Ordered, and personally interpreted labs.  The pertinent results include: UA with large leukocytes, greater than 50 WBC; rapid HIV, RPR, GC chlamydia probe pending   Problem List / ED Course / Critical interventions / Medication management   I ordered medication including Rocephin  and doxycycline for possible STI exposure  I have reviewed the patients home medicines and have made adjustments as needed   Social Determinants of Health:  Patient is a daily tobacco smoker   Test / Admission - Considered:  Patient treated for possible STI infection based on UA with large leukocytes, large amount of WBCs, reported penile discharge.  Patient will follow MyChart for results of STI testing.  Prescription sent for doxycycline.  Patient understands he may need further treatment if other test are positive.  He has been advised to refrain from sex until antibiotics are completed.         Final Clinical Impression(s) / ED Diagnoses Final diagnoses:  Possible exposure to STI    Rx / DC Orders ED Discharge Orders          Ordered    doxycycline (VIBRA-TABS) 100 MG tablet  2 times daily        12/31/23 0210              Elisa Guest, PA-C 12/31/23 0431    Eldon Greenland, MD 12/31/23 [phone]

## 2023-12-31 NOTE — ED Notes (Signed)
 Requisition form sent via tube to main lab for cytology to process in am.

## 2024-02-22 ENCOUNTER — Other Ambulatory Visit: Payer: Self-pay

## 2024-02-22 ENCOUNTER — Ambulatory Visit
Admission: EM | Admit: 2024-02-22 | Discharge: 2024-02-22 | Disposition: A | Discharge status: Home / Self Care | Payer: Self-pay

## 2024-02-22 ENCOUNTER — Encounter (HOSPITAL_COMMUNITY): Payer: Self-pay

## 2024-02-22 ENCOUNTER — Emergency Department (HOSPITAL_COMMUNITY)
Admission: EM | Admit: 2024-02-22 | Discharge: 2024-02-22 | Disposition: A | Discharge status: Home / Self Care | Payer: Self-pay | Source: Ambulatory Visit | Attending: Emergency Medicine | Admitting: Emergency Medicine

## 2024-02-22 ENCOUNTER — Emergency Department (HOSPITAL_COMMUNITY): Payer: Self-pay

## 2024-02-22 DIAGNOSIS — B349 Viral infection, unspecified: Secondary | ICD-10-CM | POA: Diagnosis not present

## 2024-02-22 DIAGNOSIS — S40012A Contusion of left shoulder, initial encounter: Secondary | ICD-10-CM | POA: Insufficient documentation

## 2024-02-22 DIAGNOSIS — T148XXA Other injury of unspecified body region, initial encounter: Secondary | ICD-10-CM

## 2024-02-22 DIAGNOSIS — R0789 Other chest pain: Secondary | ICD-10-CM

## 2024-02-22 DIAGNOSIS — R0602 Shortness of breath: Secondary | ICD-10-CM

## 2024-02-22 DIAGNOSIS — W228XXA Striking against or struck by other objects, initial encounter: Secondary | ICD-10-CM | POA: Diagnosis not present

## 2024-02-22 DIAGNOSIS — M25512 Pain in left shoulder: Secondary | ICD-10-CM | POA: Diagnosis present

## 2024-02-22 LAB — RESP PANEL BY RT-PCR (RSV, FLU A&B, COVID)  RVPGX2
Influenza A by PCR: NEGATIVE
Influenza B by PCR: NEGATIVE
Resp Syncytial Virus by PCR: NEGATIVE
SARS Coronavirus 2 by RT PCR: NEGATIVE

## 2024-02-22 MED ORDER — ACETAMINOPHEN 500 MG PO TABS: 1000.0000 mg | ORAL_TABLET | Freq: Four times a day (QID) | ORAL | 0 refills | Status: AC | PRN

## 2024-02-22 MED ORDER — BENZONATATE 100 MG PO CAPS: 100.0000 mg | ORAL_CAPSULE | Freq: Three times a day (TID) | ORAL | 0 refills | Status: AC
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
## 2024-02-22 NOTE — Progress Notes (Signed)
 Orthopedic Tech Progress Note Patient Details:  Arthur Ramos 09/07/1995 [id number]  Ortho Devices Type of Ortho Device: Sling immobilizer Ortho Device/Splint Interventions: Ordered, Adjustment, Application   Post Interventions Patient Tolerated: Well Instructions Provided: Care of device, Adjustment of device  [place] A Wonda 02/22/2024, 3:56 PM

## 2024-02-22 NOTE — Discharge Instructions (Signed)
 You have been evaluated for your symptoms.  Fortunately x-ray of your left clavicle and shoulder did not show any broken bone or dislocation.  You may wear sling for support but be sure to remove the sling and move your shoulder every few hours to decrease risk of frozen shoulder.  Your COVID, flu, RSV test came back negative.  Your symptom is likely a viral illness.  You may take cough medication as needed for cough and Tylenol  for fever or bodyaches.
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
